# Patient Record
Sex: Female | Born: 1950 | Race: Black or African American | Hispanic: No | Smoking: Never smoker
Health system: Southern US, Community
[De-identification: ages and names within clinical notes are randomized; demographics above are authoritative.]

## PROBLEM LIST (undated history)

## (undated) DIAGNOSIS — I10 Essential (primary) hypertension: Secondary | ICD-10-CM

## (undated) DIAGNOSIS — I509 Heart failure, unspecified: Secondary | ICD-10-CM

## (undated) DIAGNOSIS — J40 Bronchitis, not specified as acute or chronic: Secondary | ICD-10-CM

## (undated) HISTORY — PX: CHOLECYSTECTOMY: SHX55

---

## 2013-07-08 ENCOUNTER — Inpatient Hospital Stay (HOSPITAL_BASED_OUTPATIENT_CLINIC_OR_DEPARTMENT_OTHER)
Admission: EM | Admit: 2013-07-08 | Discharge: 2013-07-11 | DRG: 292 | Disposition: A | Discharge status: Home / Self Care | Payer: BC Managed Care – PPO | Attending: Internal Medicine | Admitting: Internal Medicine

## 2013-07-08 ENCOUNTER — Encounter (HOSPITAL_BASED_OUTPATIENT_CLINIC_OR_DEPARTMENT_OTHER): Payer: Self-pay | Admitting: Emergency Medicine

## 2013-07-08 ENCOUNTER — Emergency Department (HOSPITAL_BASED_OUTPATIENT_CLINIC_OR_DEPARTMENT_OTHER): Payer: BC Managed Care – PPO

## 2013-07-08 DIAGNOSIS — J9601 Acute respiratory failure with hypoxia: Secondary | ICD-10-CM

## 2013-07-08 DIAGNOSIS — I10 Essential (primary) hypertension: Secondary | ICD-10-CM

## 2013-07-08 DIAGNOSIS — Z79899 Other long term (current) drug therapy: Secondary | ICD-10-CM

## 2013-07-08 DIAGNOSIS — I2789 Other specified pulmonary heart diseases: Secondary | ICD-10-CM | POA: Diagnosis present

## 2013-07-08 DIAGNOSIS — Z6841 Body Mass Index (BMI) 40.0 and over, adult: Secondary | ICD-10-CM

## 2013-07-08 DIAGNOSIS — N390 Urinary tract infection, site not specified: Secondary | ICD-10-CM

## 2013-07-08 DIAGNOSIS — Z888 Allergy status to other drugs, medicaments and biological substances status: Secondary | ICD-10-CM

## 2013-07-08 DIAGNOSIS — I5033 Acute on chronic diastolic (congestive) heart failure: Principal | ICD-10-CM | POA: Diagnosis present

## 2013-07-08 DIAGNOSIS — N183 Chronic kidney disease, stage 3 unspecified: Secondary | ICD-10-CM | POA: Diagnosis present

## 2013-07-08 DIAGNOSIS — I5031 Acute diastolic (congestive) heart failure: Secondary | ICD-10-CM | POA: Diagnosis present

## 2013-07-08 DIAGNOSIS — I498 Other specified cardiac arrhythmias: Secondary | ICD-10-CM | POA: Diagnosis present

## 2013-07-08 DIAGNOSIS — I129 Hypertensive chronic kidney disease with stage 1 through stage 4 chronic kidney disease, or unspecified chronic kidney disease: Secondary | ICD-10-CM | POA: Diagnosis present

## 2013-07-08 DIAGNOSIS — I160 Hypertensive urgency: Secondary | ICD-10-CM | POA: Diagnosis present

## 2013-07-08 DIAGNOSIS — Z9089 Acquired absence of other organs: Secondary | ICD-10-CM

## 2013-07-08 DIAGNOSIS — E441 Mild protein-calorie malnutrition: Secondary | ICD-10-CM | POA: Diagnosis present

## 2013-07-08 DIAGNOSIS — E66813 Obesity, class 3: Secondary | ICD-10-CM | POA: Diagnosis present

## 2013-07-08 DIAGNOSIS — I272 Pulmonary hypertension, unspecified: Secondary | ICD-10-CM

## 2013-07-08 DIAGNOSIS — I509 Heart failure, unspecified: Secondary | ICD-10-CM | POA: Diagnosis present

## 2013-07-08 DIAGNOSIS — A5901 Trichomonal vulvovaginitis: Secondary | ICD-10-CM | POA: Diagnosis present

## 2013-07-08 HISTORY — DX: Bronchitis, not specified as acute or chronic: J40

## 2013-07-08 HISTORY — DX: Morbid (severe) obesity due to excess calories: E66.01

## 2013-07-08 LAB — CBC
HCT: 37.6 % (ref 36.0–46.0)
HEMOGLOBIN: 12.1 g/dL (ref 12.0–15.0)
MCH: 29.5 pg (ref 26.0–34.0)
MCHC: 32.2 g/dL (ref 30.0–36.0)
MCV: 91.7 fL (ref 78.0–100.0)
Platelets: 258 10*3/uL (ref 150–400)
RBC: 4.1 MIL/uL (ref 3.87–5.11)
RDW: 15.5 % (ref 11.5–15.5)
WBC: 11.7 10*3/uL — ABNORMAL HIGH (ref 4.0–10.5)

## 2013-07-08 LAB — TROPONIN I: Troponin I: <0.3 ng/mL (ref <0.30)

## 2013-07-08 LAB — BASIC METABOLIC PANEL
BUN: 17 mg/dL (ref 6–23)
CALCIUM: 9.6 mg/dL (ref 8.4–10.5)
CO2: 27 meq/L (ref 19–32)
Chloride: 106 mEq/L (ref 96–112)
Creatinine, Ser: 1 mg/dL (ref 0.50–1.10)
GFR calc Af Amer: 68 mL/min — ABNORMAL LOW (ref >90)
GFR calc non Af Amer: 59 mL/min — ABNORMAL LOW (ref >90)
GLUCOSE: 128 mg/dL — AB (ref 70–99)
Potassium: 3.8 mEq/L (ref 3.7–5.3)
SODIUM: 145 meq/L (ref 137–147)

## 2013-07-08 LAB — PRO B NATRIURETIC PEPTIDE: PRO B NATRI PEPTIDE: 2519 pg/mL — AB (ref 0–125)

## 2013-07-08 MED ORDER — ALBUTEROL SULFATE (2.5 MG/3ML) 0.083% IN NEBU
5.0000 mg | INHALATION_SOLUTION | Freq: Once | RESPIRATORY_TRACT | Status: AC
  Administered 2013-07-08: 5 mg via RESPIRATORY_TRACT
  Filled 2013-07-08: qty 6

## 2013-07-08 MED ORDER — FUROSEMIDE 10 MG/ML IJ SOLN
40.0000 mg | Freq: Two times a day (BID) | INTRAMUSCULAR | Status: DC
  Administered 2013-07-08: 40 mg via INTRAVENOUS
  Filled 2013-07-08 (×4): qty 4

## 2013-07-08 MED ORDER — HYDRALAZINE HCL 20 MG/ML IJ SOLN
10.0000 mg | INTRAMUSCULAR | Status: DC | PRN
  Administered 2013-07-08: 10 mg via INTRAVENOUS
  Filled 2013-07-08 (×2): qty 1

## 2013-07-08 MED ORDER — LORAZEPAM 0.5 MG PO TABS
0.5000 mg | ORAL_TABLET | Freq: Once | ORAL | Status: AC
  Administered 2013-07-08: 0.5 mg via ORAL
  Filled 2013-07-08: qty 1

## 2013-07-08 MED ORDER — FUROSEMIDE 10 MG/ML IJ SOLN
40.0000 mg | Freq: Once | INTRAMUSCULAR | Status: AC
  Administered 2013-07-08: 40 mg via INTRAVENOUS
  Filled 2013-07-08: qty 4

## 2013-07-08 MED ORDER — SODIUM CHLORIDE 0.9 % IJ SOLN
3.0000 mL | Freq: Two times a day (BID) | INTRAMUSCULAR | Status: DC
  Administered 2013-07-08 – 2013-07-11 (×5): 3 mL via INTRAVENOUS

## 2013-07-08 MED ORDER — NITROGLYCERIN 0.4 MG SL SUBL
0.4000 mg | SUBLINGUAL_TABLET | SUBLINGUAL | Status: DC | PRN
Start: 2013-07-08 — End: 2013-07-11
  Filled 2013-07-08: qty 25

## 2013-07-08 MED ORDER — HYDROCODONE-ACETAMINOPHEN 5-325 MG PO TABS
1.0000 | ORAL_TABLET | Freq: Four times a day (QID) | ORAL | Status: DC | PRN
  Filled 2013-07-08 (×2): qty 1

## 2013-07-08 MED ORDER — HEPARIN SODIUM (PORCINE) 5000 UNIT/ML IJ SOLN
5000.0000 [IU] | Freq: Three times a day (TID) | INTRAMUSCULAR | Status: DC
  Administered 2013-07-08 – 2013-07-11 (×8): 5000 [IU] via SUBCUTANEOUS
  Filled 2013-07-08 (×11): qty 1

## 2013-07-08 MED ORDER — ACETAMINOPHEN 325 MG PO TABS
650.0000 mg | ORAL_TABLET | Freq: Four times a day (QID) | ORAL | Status: DC | PRN
  Administered 2013-07-08 – 2013-07-10 (×5): 650 mg via ORAL
  Filled 2013-07-08 (×5): qty 2

## 2013-07-08 MED ORDER — IBUPROFEN 800 MG PO TABS
800.0000 mg | ORAL_TABLET | Freq: Once | ORAL | Status: AC
  Administered 2013-07-08: 800 mg via ORAL
  Filled 2013-07-08: qty 1

## 2013-07-08 MED ORDER — ONDANSETRON HCL 4 MG/2ML IJ SOLN
4.0000 mg | Freq: Four times a day (QID) | INTRAMUSCULAR | Status: DC | PRN
  Administered 2013-07-08: 4 mg via INTRAVENOUS
  Filled 2013-07-08: qty 2

## 2013-07-08 MED ORDER — ASPIRIN EC 325 MG PO TBEC
325.0000 mg | DELAYED_RELEASE_TABLET | Freq: Every day | ORAL | Status: DC
  Administered 2013-07-08 – 2013-07-11 (×4): 325 mg via ORAL
  Filled 2013-07-08 (×4): qty 1

## 2013-07-08 MED ORDER — NITROGLYCERIN 0.4 MG SL SUBL
0.4000 mg | SUBLINGUAL_TABLET | Freq: Once | SUBLINGUAL | Status: AC
  Administered 2013-07-08: 0.4 mg via SUBLINGUAL
  Filled 2013-07-08: qty 1
  Filled 2013-07-08: qty 25

## 2013-07-08 NOTE — H&P (Signed)
Hospitalist Admission History and Physical  Patient name: Teresa Blackburn Medical record number: [id number] Date of birth: 05/31/1950 Age: 63 y.o. Gender: female  Primary Care Provider: No primary provider on file.  Chief Complaint: dyspnea, LE swelling, new onset CHF   History of Present Illness:This is a 63 y.o. year old morbidly obese female with no prior medical follow up presenting with progressive dyspnea, LE swelling and new onset CHF. Pt states that she has had progressive dyspnea and LE swelling over the past 3-4 months. Has been [facility] multiple [facility] for cough, dxd with bronchitis. Has had intermittent chest discomfort, orthopnea, and progressive edema. Has had to sleep in a chair for the last 2 months. Has been taking intermittent NSAIDs over this time frame as well as high salt intake. Has not had regular PCP follow up in the past. Denies any dysuria, nausea, vomiting, diarrhea, hemiparesis, confusion, headache, abd pain.   [facility] with worsening dyspnea. No noted hypoxia. Had CXR c/w CHF. Pro BNP @ 2500. Trop and EKG WNL. Noted WBC @ 11.7. Cr @ 1 w/ GFR 59. Noted BPs 200s/100s. Received lasix 40mg  IV x1.   Patient Active Problem List   Diagnosis Date Noted  . CHF (congestive heart failure) 07/08/2013  . CHF exacerbation 07/08/2013   Past Medical History: Past Medical History  Diagnosis Date  . Bronchitis   . Morbid obesity     Past Surgical History: Past Surgical History  Procedure Laterality Date  . Cesarean section    . Cholecystectomy      Social History: History   Social History  . Marital Status: Widowed    Spouse Name: N/A    Number of Children: N/A  . Years of Education: N/A   Social History Main Topics  . Smoking status: Never Smoker   . Smokeless tobacco: None  . Alcohol Use: No  . Drug Use: No  . Sexual Activity: None   Other Topics Concern  . None   Social History Narrative  . None    Family History: No family  history on file.  Allergies: Allergies  Allergen Reactions  . Codeine     Current Facility-Administered Medications  Medication Dose Route Frequency Provider Last Rate Last Dose  . aspirin EC tablet 325 mg  325 mg Oral Daily Doree AlbeeSteven Sherion Dooly, MD      . furosemide (LASIX) injection 40 mg  40 mg Intravenous Q12H Doree AlbeeSteven Caryl Fate, MD      . heparin injection 5,000 Units  5,000 Units Subcutaneous 3 times per day Doree AlbeeSteven Darlis Wragg, MD      . hydrALAZINE (APRESOLINE) injection 10 mg  10 mg Intravenous Q4H PRN Doree AlbeeSteven Grettell Ransdell, MD      . nitroGLYCERIN (NITROSTAT) SL tablet 0.4 mg  0.4 mg Sublingual Q5 min PRN Ethelda ChickMartha K Linker, MD      . sodium chloride 0.9 % injection 3 mL  3 mL Intravenous Q12H Doree AlbeeSteven Dayshaun Whobrey, MD       Review Of Systems: 12 point ROS negative except as noted above in HPI.  Physical Exam: Filed Vitals:   07/08/13 2100  BP:   Pulse: 89  Temp: 98.1 F (36.7 C)  Resp: 22    General: alert, no distress and morbidly obese HEENT: PERRLA and extra ocular movement intact Heart: S1, S2 normal, no murmur, rub or gallop, regular rate and rhythm Lungs: clear to auscultation, no wheezes or rales and unlabored breathing Abdomen: abdomen is soft without significant tenderness, masses, organomegaly or  guarding Extremities: 2+ peripheral pulses, >3+ edema bilaterally, no popliteal tenderness Skin:no rashes, no ecchymoses Neurology: normal without focal findings  Labs and Imaging: Lab Results  Component Value Date/Time   NA 145 07/08/2013  9:20 AM   K 3.8 07/08/2013  9:20 AM   CL 106 07/08/2013  9:20 AM   CO2 27 07/08/2013  9:20 AM   BUN 17 07/08/2013  9:20 AM   CREATININE 1.00 07/08/2013  9:20 AM   GLUCOSE 128* 07/08/2013  9:20 AM   Lab Results  Component Value Date   WBC 11.7* 07/08/2013   HGB 12.1 07/08/2013   HCT 37.6 07/08/2013   MCV 91.7 07/08/2013   PLT 258 07/08/2013    Dg Chest 2 View  07/08/2013   CLINICAL DATA:  Shortness of breath  EXAM: CHEST  2 VIEW  COMPARISON:  None.  FINDINGS: Cardiac  shadow is at the upper limits of normal in size. Mild vascular congestion is noted as well as some patchy infiltrative changes bilaterally. This likely represents a component of congestive failure. Correlation with the clinical exam is recommended. No bony abnormality is noted.  IMPRESSION: Bilateral changes likely related to congestive failure.   Electronically Signed   By: Alcide Clever M.D.   On: 07/08/2013 08:31     Assessment and Plan: [name] is a 63 y.o. year old female presenting with dyspnea, LE swelling, CHF  CHF: New diagnosis. Likely multifactorial etiology with body habitus, salt intake and NSAID use contributing. IV lasix. 2D ECHO. Cycle CEs. Risk stratification labs. Daily weights. Strict is and Os. Telemetry bed.   Hypertensive urgency: suspect fluid status likely exacerbating this issue. Would like to see response to diuresis. Prn hydralazine. Telemetry bed. Risk stratification labs. Full dose ASA. Consider addition of NTG gtt if recalcitrant to diuresis.   Leukocycytosis: ? Reactive in setting of current presentation. No current signs of infections. CXR w/o infiltrate. Afebrile. Check UA. Will trend.   FEN/GI: heart healthy, low sodium diet.  Prophylaxis: sub q heparin  Disposition: pending further evaluation.  Code Status:full code.        Doree Albee MD  Pager: [phone]

## 2013-07-08 NOTE — ED Notes (Addendum)
Attempted two manual BP pressures and changed the BP cuff and still unable to obtain a BP, informed Pts Nurse Konrad FelixKatelyn

## 2013-07-08 NOTE — ED Notes (Signed)
Pt in bathroom.  States that she is okay and will call out when needed.

## 2013-07-08 NOTE — ED Notes (Signed)
Pt transported via [facility]  

## 2013-07-08 NOTE — ED Provider Notes (Signed)
CSN: [id number]     Arrival date & time 07/08/13  2130 History   First MD Initiated Contact with Patient 07/08/13 [id number]     Chief Complaint  Patient presents with  . Shortness of Breath     (Consider location/radiation/quality/duration/timing/severity/associated sxs/prior Treatment) HPI Pt presents with shortness of breath and cough.  She states that symptoms began 3-4 days ago and feel like her prior episodes of bronchitis.  She has not taken anything for her symptoms.  Cough is nonproductive.  She feels that her shortness of breath is worse than prior episodes.  No chest pain. No fever/chills.  No change in her baseline leg swelling.  There are no other associated systemic symptoms, there are no other alleviating or modifying factors.   Past Medical History  Diagnosis Date  . Bronchitis   . Morbid obesity    Past Surgical History  Procedure Laterality Date  . Cesarean section    . Cholecystectomy     No family history on file. History  Substance Use Topics  . Smoking status: Never Smoker   . Smokeless tobacco: Not on file  . Alcohol Use: No   OB History   Grav Para Term Preterm Abortions TAB SAB Ect Mult Living                 Review of Systems ROS reviewed and all otherwise negative except for mentioned in HPI    Allergies  Codeine  Home Medications  No current outpatient prescriptions on file. BP 207/99  Pulse 96  Temp(Src) 98.4 F (36.9 C) (Oral)  Resp 20  Ht 5\' 2"  (1.575 m)  Wt 320 lb (145.151 kg)  BMI 58.51 kg/m2  SpO2 99% Vitals reviewed Physical Exam Physical Examination: General appearance - alert, well appearing, and in no distress, overweight Mental status - alert, oriented to person, place, and time Eyes - no conjunctival injection, no scleral icterus Mouth - mucous membranes moist, pharynx normal without lesions Chest - clear to auscultation, no wheezes, rales or rhonchi, symmetric air entry, decreased air movement bilaterally Heart - normal  rate, regular rhythm, normal S1, S2, no murmurs, rubs, clicks or gallops Abdomen - soft, nontender, nondistended, no masses or organomegaly Extremities - peripheral pulses normal, no pedal edema- nonpitting edema at ankles bilaterally, no clubbing or cyanosis Skin - normal coloration and turgor, no rashes  ED Course  Procedures (including critical care time)  11:48 AM pt has been in the bathroom, she has just come out and I have updated her about findings and plan for admission.  She [facility].   12:11 PM d/w Dr. Waymon Amato, pending repeat manual blood pressure to determine step down versus telemetry. Will let [facility] know.   CRITICAL CARE Performed by: Ethelda Chick Total critical care time: 35 Critical care time was exclusive of separately billable procedures and treating other patients. Critical care was necessary to treat or prevent imminent or life-threatening deterioration. Critical care was time spent personally by me on the following activities: development of treatment plan with patient and/or surrogate as well as nursing, discussions with consultants, evaluation of patient's response to treatment, examination of patient, obtaining history from patient or surrogate, ordering and performing treatments and interventions, ordering and review of laboratory studies, ordering and review of radiographic studies, pulse oximetry and re-evaluation of patient's condition.   Date: 07/08/2013  Rate: 106  Rhythm: sinus tachycardia  QRS Axis: normal  Intervals: normal  ST/T Wave abnormalities: normal  Conduction Disutrbances:none  Narrative Interpretation:   Old EKG Reviewed: none available EKG not available in EPIC for interpretation in MUSE  Labs Review Labs Reviewed  CBC - Abnormal; Notable for the following:    WBC 11.7 (*)    All other components within normal limits  BASIC METABOLIC PANEL - Abnormal; Notable for the following:    Glucose, Bld 128 (*)     GFR calc non Af Amer 59 (*)    GFR calc Af Amer 68 (*)    All other components within normal limits  PRO B NATRIURETIC PEPTIDE - Abnormal; Notable for the following:    Pro B Natriuretic peptide (BNP) 2519.0 (*)    All other components within normal limits  TROPONIN I   Imaging Review Dg Chest 2 View  07/08/2013   CLINICAL DATA:  Shortness of breath  EXAM: CHEST  2 VIEW  COMPARISON:  None.  FINDINGS: Cardiac shadow is at the upper limits of normal in size. Mild vascular congestion is noted as well as some patchy infiltrative changes bilaterally. This likely represents a component of congestive failure. Correlation with the clinical exam is recommended. No bony abnormality is noted.  IMPRESSION: Bilateral changes likely related to congestive failure.   Electronically Signed   By: Alcide CleverMark  Lukens M.D.   On: 07/08/2013 08:31     EKG Interpretation None      MDM   Final diagnoses:  CHF exacerbation  Hypertension    Pt presenting with c/o shortness of breath and cough.  Workup reveals fluid overload on CXR. EKG shows sinus tachycardia, pt intially with blood pressure relatively normal, repeat values have been elevated, pt given lasix IV, as well as nitroglycerin.  [facility]     Ethelda ChickMartha K Linker, MD 07/08/13 [phone]

## 2013-07-08 NOTE — ED Notes (Signed)
Difficult manual blood pressure.

## 2013-07-08 NOTE — ED Notes (Addendum)
Pt c/o shortness of breath x 2 days. Pt sts this happens this time of year and has been diagnosed with bronchitis. Pt sts she does not have a PCP. Pt also c/o left foot pain x 5 months.

## 2013-07-08 NOTE — ED Notes (Signed)
Pt in the bathroom.  Manual BP to be obtained when pt returns to room.

## 2013-07-08 NOTE — ED Notes (Signed)
Phone report given to [facility] 

## 2013-07-09 DIAGNOSIS — I272 Pulmonary hypertension, unspecified: Secondary | ICD-10-CM | POA: Diagnosis present

## 2013-07-09 DIAGNOSIS — I160 Hypertensive urgency: Secondary | ICD-10-CM | POA: Diagnosis present

## 2013-07-09 DIAGNOSIS — J9601 Acute respiratory failure with hypoxia: Secondary | ICD-10-CM | POA: Diagnosis present

## 2013-07-09 DIAGNOSIS — E66813 Obesity, class 3: Secondary | ICD-10-CM | POA: Diagnosis present

## 2013-07-09 DIAGNOSIS — N183 Chronic kidney disease, stage 3 unspecified: Secondary | ICD-10-CM | POA: Diagnosis present

## 2013-07-09 DIAGNOSIS — I10 Essential (primary) hypertension: Secondary | ICD-10-CM

## 2013-07-09 DIAGNOSIS — I369 Nonrheumatic tricuspid valve disorder, unspecified: Secondary | ICD-10-CM

## 2013-07-09 DIAGNOSIS — I5031 Acute diastolic (congestive) heart failure: Secondary | ICD-10-CM | POA: Diagnosis present

## 2013-07-09 DIAGNOSIS — J9600 Acute respiratory failure, unspecified whether with hypoxia or hypercapnia: Secondary | ICD-10-CM

## 2013-07-09 LAB — CBC WITH DIFFERENTIAL/PLATELET
BASOS PCT: 0 % (ref 0–1)
Basophils Absolute: 0 10*3/uL (ref 0.0–0.1)
EOS ABS: 0.1 10*3/uL (ref 0.0–0.7)
Eosinophils Relative: 1 % (ref 0–5)
HEMATOCRIT: 33.2 % — AB (ref 36.0–46.0)
HEMOGLOBIN: 10.7 g/dL — AB (ref 12.0–15.0)
Lymphocytes Relative: 17 % (ref 12–46)
Lymphs Abs: 1.9 10*3/uL (ref 0.7–4.0)
MCH: 29.3 pg (ref 26.0–34.0)
MCHC: 32.2 g/dL (ref 30.0–36.0)
MCV: 91 fL (ref 78.0–100.0)
MONO ABS: 0.8 10*3/uL (ref 0.1–1.0)
Monocytes Relative: 7 % (ref 3–12)
Neutro Abs: 8.1 10*3/uL — ABNORMAL HIGH (ref 1.7–7.7)
Neutrophils Relative %: 75 % (ref 43–77)
Platelets: 246 10*3/uL (ref 150–400)
RBC: 3.65 MIL/uL — ABNORMAL LOW (ref 3.87–5.11)
RDW: 15.7 % — ABNORMAL HIGH (ref 11.5–15.5)
WBC: 10.9 10*3/uL — ABNORMAL HIGH (ref 4.0–10.5)

## 2013-07-09 LAB — TSH: TSH: 2.04 u[IU]/mL (ref 0.350–4.500)

## 2013-07-09 LAB — COMPREHENSIVE METABOLIC PANEL
ALK PHOS: 56 U/L (ref 39–117)
ALT: 36 U/L — AB (ref 0–35)
AST: 33 U/L (ref 0–37)
Albumin: 3.1 g/dL — ABNORMAL LOW (ref 3.5–5.2)
BUN: 16 mg/dL (ref 6–23)
CALCIUM: 9 mg/dL (ref 8.4–10.5)
CO2: 27 mEq/L (ref 19–32)
Chloride: 105 mEq/L (ref 96–112)
Creatinine, Ser: 1.28 mg/dL — ABNORMAL HIGH (ref 0.50–1.10)
GFR calc Af Amer: 51 mL/min — ABNORMAL LOW (ref >90)
GFR calc non Af Amer: 44 mL/min — ABNORMAL LOW (ref >90)
Glucose, Bld: 99 mg/dL (ref 70–99)
POTASSIUM: 3.8 meq/L (ref 3.7–5.3)
Sodium: 145 mEq/L (ref 137–147)
TOTAL PROTEIN: 6.6 g/dL (ref 6.0–8.3)
Total Bilirubin: 0.6 mg/dL (ref 0.3–1.2)

## 2013-07-09 LAB — CBC
HEMATOCRIT: 34.9 % — AB (ref 36.0–46.0)
HEMOGLOBIN: 11.4 g/dL — AB (ref 12.0–15.0)
MCH: 29.5 pg (ref 26.0–34.0)
MCHC: 32.7 g/dL (ref 30.0–36.0)
MCV: 90.2 fL (ref 78.0–100.0)
Platelets: 245 10*3/uL (ref 150–400)
RBC: 3.87 MIL/uL (ref 3.87–5.11)
RDW: 15.5 % (ref 11.5–15.5)
WBC: 11.7 10*3/uL — AB (ref 4.0–10.5)

## 2013-07-09 LAB — CREATININE, SERUM
Creatinine, Ser: 0.91 mg/dL (ref 0.50–1.10)
GFR calc non Af Amer: 66 mL/min — ABNORMAL LOW (ref >90)
GFR, EST AFRICAN AMERICAN: 77 mL/min — AB (ref >90)

## 2013-07-09 LAB — LIPID PANEL
CHOL/HDL RATIO: 2.4 ratio
CHOLESTEROL: 115 mg/dL (ref 0–200)
HDL: 48 mg/dL (ref >39)
LDL Cholesterol: 57 mg/dL (ref 0–99)
Triglycerides: 49 mg/dL (ref <150)
VLDL: 10 mg/dL (ref 0–40)

## 2013-07-09 LAB — MRSA PCR SCREENING: MRSA by PCR: NEGATIVE

## 2013-07-09 LAB — TROPONIN I
Troponin I: <0.3 ng/mL (ref <0.30)
Troponin I: <0.3 ng/mL (ref <0.30)
Troponin I: <0.3 ng/mL (ref <0.30)

## 2013-07-09 LAB — HEMOGLOBIN A1C
Hgb A1c MFr Bld: 5.6 % (ref <5.7)
MEAN PLASMA GLUCOSE: 114 mg/dL (ref <117)

## 2013-07-09 MED ORDER — LISINOPRIL 5 MG PO TABS: 5.0000 mg | ORAL_TABLET | Freq: Every day | ORAL | Status: DC

## 2013-07-09 MED ORDER — BENZONATATE 100 MG PO CAPS
100.0000 mg | ORAL_CAPSULE | Freq: Four times a day (QID) | ORAL | Status: DC | PRN
  Administered 2013-07-09: 100 mg via ORAL
  Filled 2013-07-09: qty 1

## 2013-07-09 MED ORDER — AMLODIPINE BESYLATE 10 MG PO TABS
10.0000 mg | ORAL_TABLET | Freq: Every day | ORAL | Status: DC
  Administered 2013-07-09 – 2013-07-11 (×3): 10 mg via ORAL
  Filled 2013-07-09 (×3): qty 1

## 2013-07-09 NOTE — Progress Notes (Signed)
  Echocardiogram 2D Echocardiogram has been performed.  Jorje GuildCHUNG, Chamaine Stankus 07/09/2013, 10:38 AM

## 2013-07-09 NOTE — Progress Notes (Signed)
Notified hospitalist of patient requesting something for severe cough. Orders given for Tessalon pearl. Carried out order. Will monitor patient to end of shift.

## 2013-07-09 NOTE — Progress Notes (Signed)
Utilization Review Completed.  

## 2013-07-09 NOTE — Care Management Note (Signed)
    Page 1 of 1   07/10/2013     10:36:59 AM   CARE MANAGEMENT NOTE 07/10/2013  Patient:  Elba BarmanHUNTER,Chelesa   Account Number:  [id number]  Date Initiated:  07/09/2013  Documentation initiated by:  MAYO,HENRIETTA  Subjective/Objective Assessment:   dx CHF; lives with son, no PCP     Action/Plan:   Provided pt with number for [facility] which will give her contact information for doctors accepting new patients and contracted with her insurance   Anticipated DC Date:  07/11/2013   Anticipated DC Plan:  HOME/SELF CARE      DC Planning Services  CM consult  PCP issues      Choice offered to / List presented to:             Status of service:   Medicare Important Message given?   (If response is "NO", the following Medicare IM given date fields will be blank) Date Medicare IM given:   Date Additional Medicare IM given:    Discharge Disposition:    Per UR Regulation:  Reviewed for med. necessity/level of care/duration of stay  If discussed at Long Length of Stay Meetings, dates discussed:    Comments:  07/10/13 0950 Oletta Cohnamellia Dilia Alemany, RN, BSN, NCM [phone] Offered pt list of resources for Primary Care Providers to choose a PCP.  [place] and would prefer a PCP in that area.  Will follow up with pt prior to d/c home with her progression.

## 2013-07-09 NOTE — Progress Notes (Signed)
[facility]  Elba BarmanBrenda Mcallister WGN:[id number] DOB: [birth year] DOA: 07/08/2013 PCP: No primary provider on file.  Time spent :  Brief narrative: 63 y.o. year old morbidly obese female with no prior medical follow up presenting with progressive dyspnea, LE swelling and new onset CHF. Pt states that she has had progressive dyspnea and LE swelling over the past 3-4 months. Has been seen multiple times at local [facility]Cs for cough, dxd with bronchitis. Has had intermittent chest discomfort, orthopnea, and progressive edema. Has had to sleep in a chair for the last 2 months. Has been taking intermittent NSAIDs over this time frame as well as high salt intake. Has not had regular PCP follow up in the past. Denies any dysuria, nausea, vomiting, diarrhea, hemiparesis, confusion, headache, abd pain.  [facility] dysp[facility]. No noted hypoxia. Had CXR c/w CHF. Pro BNP @ 2500. Trop and EKG WNL. Noted WBC @ 11.7. Cr @ 1 w/ GFR 59. Noted BPs 200s/100s. Received lasix 40mg  IV x1.    HPI/Subjective: No further SOB. Says hasn't been to MD regularly for preventive care in years.  Assessment/Plan: Active Problems: HTN urgency -BP moderate control -begin ACE I (see below) -cont prn IV Hydralazine  Acute resp failure with hypoxia/Acute grade 2 diastolic CHF exacerbation -no hypoxia -lungs clear on exam so dc Lasix -has diastolic dysfunction- ACE will not help- start CCB and d/c home in AM if BP controlled  CKD class 3 -baseline Scr ??- current readings c/w  Stage 3  Morbid obesity, BMI 55, Class 3 -nutrition counseling for weight reduction strategies and low sodium diet  Pulmonary HTN _ new finding of moderate pulmonary HTN -likely needs OP PSG    DVT prophylaxis: Subcutaneous heparin Code Status: Full Family Communication: Patient and daughters at bedside Disposition Plan/Expected LOS: Transfer to floor   Consultants: None  Procedures: 2-D  echocardiogram  - Left ventricle: The cavity size was normal. There was moderate concentric hypertrophy. Systolic function was normal. The estimated ejection fraction was in the range of 50% to 55%. Wall motion was normal; there were no regional wall motion abnormalities. Features are consistent with a pseudonormal left ventricular filling pattern, with concomitant abnormal relaxation and increased filling pressure (grade 2 diastolic dysfunction). Doppler parameters are consistent with elevated ventricular end-diastolic filling pressure. - Aortic valve: Trileaflet; normal thickness leaflets. No regurgitation. - Mitral valve: Trivial regurgitation. - Left atrium: The atrium was moderately dilated. - Right ventricle: Systolic function was normal. - Tricuspid valve: Mild regurgitation. - Pulmonic valve: No regurgitation. - Pulmonary arteries: Systolic pressure was moderately increased. PA peak pressure: 52mm Hg (S). - Inferior vena cava: The vessel was normal in size. - Pericardium, extracardiac: There was no pericardial effusion.  Antibiotics: None  Objective: Blood pressure 144/71, pulse 85, temperature 98.1 F (36.7 C), temperature source Oral, resp. rate 25, height 5\' 2"  (1.575 m), weight 334 lb 14.1 oz (151.9 kg), SpO2 97.00%.  Intake/Output Summary (Last 24 hours) at 07/09/13 1221 Last data filed at 07/09/13 0037  Gross per 24 hour  Intake      0 ml  Output      0 ml  Net      0 ml     Exam: General: No acute respiratory distress Lungs: Clear to auscultation bilaterally without wheezes or crackles, RA Cardiovascular: Regular rate and rhythm without murmur gallop or rub normal S1 and S2, no peripheral edema or JVD Abdomen: Nontender, nondistended, soft, bowel  sounds positive, no rebound, no ascites, no appreciable mass Musculoskeletal: No significant cyanosis, clubbing of bilateral lower extremities Neurological: Alert and oriented x 3, moves all extremities x 4  without focal neurological deficits, CN 2-12 intact  Scheduled Meds:  Scheduled Meds: . aspirin EC  325 mg Oral Daily  . heparin  5,000 Units Subcutaneous 3 times per day  . sodium chloride  3 mL Intravenous Q12H   Continuous Infusions:   Data Reviewed: Basic Metabolic Panel:  Recent Labs Lab 07/08/13 0920 07/08/13 2332 07/09/13 0408  NA 145  --  145  K 3.8  --  3.8  CL 106  --  105  CO2 27  --  27  GLUCOSE 128*  --  99  BUN 17  --  16  CREATININE 1.00 0.91 1.28*  CALCIUM 9.6  --  9.0   Liver Function Tests:  Recent Labs Lab 07/09/13 0408  AST 33  ALT 36*  ALKPHOS 56  BILITOT 0.6  PROT 6.6  ALBUMIN 3.1*   No results found for this basename: LIPASE, AMYLASE,  in the last 168 hours No results found for this basename: AMMONIA,  in the last 168 hours CBC:  Recent Labs Lab 07/08/13 0920 07/08/13 2332 07/09/13 0408  WBC 11.7* 11.7* 10.9*  NEUTROABS  --   --  8.1*  HGB 12.1 11.4* 10.7*  HCT 37.6 34.9* 33.2*  MCV 91.7 90.2 91.0  PLT 258 245 246   Cardiac Enzymes:  Recent Labs Lab 07/08/13 0920 07/08/13 2322 07/09/13 0408 07/09/13 1040  TROPONINI <0.30 <0.30 <0.30 <0.30   BNP (last 3 results)  Recent Labs  07/08/13 0920  PROBNP 2519.0*   CBG: No results found for this basename: GLUCAP,  in the last 168 hours  Recent Results (from the past 240 hour(s))  MRSA PCR SCREENING     Status: None   Collection Time    07/08/13  9:54 PM      Result Value Ref Range Status   MRSA by PCR NEGATIVE  NEGATIVE Final   Comment:            The GeneXpert MRSA Assay (FDA     approved for NASAL specimens     only), is one component of a     comprehensive MRSA colonization     surveillance program. It is not     intended to diagnose MRSA     infection nor to guide or     monitor treatment for     MRSA infections.     Studies:  Recent x-ray studies have been reviewed in detail by the Attending Physician       Junious Silk, ANP [facility] Office  [phone] Pager [phone]  **If unable to reach the above provider after paging please contact the Flow Manager @ [phone]  On-Call/Text Page:      [email or web address]      password [email or web address]  If 7PM-7AM, please contact night-coverage [email or web address] Password [email or web address] 07/09/2013, 12:21 PM   LOS: 1 day

## 2013-07-09 NOTE — Clinical Documentation Improvement (Signed)
PLEASE SPECIFY TYPE AND ACUITY OF CHF Possible Clinical Conditions?  Acute Systolic Congestive Heart Failure Acute Diastolic Congestive Heart Failure Acute Systolic & Diastolic Congestive Heart Failure Acute on Chronic Systolic Congestive Heart Failure Acute on Chronic Diastolic Congestive Heart Failure Acute on Chronic Systolic & Diastolic Congestive Heart Failure Chronic Systolic Congestive Heart Failure Chronic Diastolic Congestive Heart Failure Chronic Systolic & Diastolic Congestive Heart Failure Other Condition Cannot Clinically Determine  Supporting Information: (As per notes) "CHF: New diagnosis. Likely multifactorial etiology with body habitus, salt intake and NSAID use contributing. IV lasix. 2D ECHO. Cycle CEs. Risk stratification labs. Daily weights. Strict is and Os. Telemetry bed."    Thank You, Nevin BloodgoodJoan B Nimah Uphoff, RN, BSN, CCDS, Clinical Documentation Specialist:  [phone]   [phone] [facility] [place]- Health Information Management

## 2013-07-10 LAB — CBC WITH DIFFERENTIAL/PLATELET
BASOS ABS: 0.1 10*3/uL (ref 0.0–0.1)
BASOS PCT: 1 % (ref 0–1)
Eosinophils Absolute: 0.4 10*3/uL (ref 0.0–0.7)
Eosinophils Relative: 6 % — ABNORMAL HIGH (ref 0–5)
HCT: 34.5 % — ABNORMAL LOW (ref 36.0–46.0)
HEMOGLOBIN: 11.3 g/dL — AB (ref 12.0–15.0)
Lymphocytes Relative: 35 % (ref 12–46)
Lymphs Abs: 2.7 10*3/uL (ref 0.7–4.0)
MCH: 29.8 pg (ref 26.0–34.0)
MCHC: 32.8 g/dL (ref 30.0–36.0)
MCV: 91 fL (ref 78.0–100.0)
MONOS PCT: 10 % (ref 3–12)
Monocytes Absolute: 0.8 10*3/uL (ref 0.1–1.0)
NEUTROS ABS: 3.7 10*3/uL (ref 1.7–7.7)
Neutrophils Relative %: 49 % (ref 43–77)
Platelets: 231 10*3/uL (ref 150–400)
RBC: 3.79 MIL/uL — ABNORMAL LOW (ref 3.87–5.11)
RDW: 15.8 % — ABNORMAL HIGH (ref 11.5–15.5)
WBC: 7.7 10*3/uL (ref 4.0–10.5)

## 2013-07-10 LAB — URINALYSIS, ROUTINE W REFLEX MICROSCOPIC
BILIRUBIN URINE: NEGATIVE
GLUCOSE, UA: NEGATIVE mg/dL
Ketones, ur: NEGATIVE mg/dL
Nitrite: NEGATIVE
PH: 7.5 (ref 5.0–8.0)
Protein, ur: 30 mg/dL — AB
Specific Gravity, Urine: 1.017 (ref 1.005–1.030)
Urobilinogen, UA: 1 mg/dL (ref 0.0–1.0)

## 2013-07-10 LAB — COMPREHENSIVE METABOLIC PANEL
ALT: 36 U/L — AB (ref 0–35)
AST: 30 U/L (ref 0–37)
Albumin: 3 g/dL — ABNORMAL LOW (ref 3.5–5.2)
Alkaline Phosphatase: 53 U/L (ref 39–117)
BUN: 21 mg/dL (ref 6–23)
CALCIUM: 8.6 mg/dL (ref 8.4–10.5)
CO2: 26 meq/L (ref 19–32)
CREATININE: 1.27 mg/dL — AB (ref 0.50–1.10)
Chloride: 105 mEq/L (ref 96–112)
GFR, EST AFRICAN AMERICAN: 51 mL/min — AB (ref >90)
GFR, EST NON AFRICAN AMERICAN: 44 mL/min — AB (ref >90)
Glucose, Bld: 92 mg/dL (ref 70–99)
Potassium: 4.1 mEq/L (ref 3.7–5.3)
SODIUM: 144 meq/L (ref 137–147)
Total Bilirubin: 0.4 mg/dL (ref 0.3–1.2)
Total Protein: 6.6 g/dL (ref 6.0–8.3)

## 2013-07-10 LAB — URINE MICROSCOPIC-ADD ON

## 2013-07-10 MED ORDER — CARVEDILOL 3.125 MG PO TABS
3.1250 mg | ORAL_TABLET | Freq: Two times a day (BID) | ORAL | Status: DC
  Administered 2013-07-11: 3.125 mg via ORAL
  Filled 2013-07-10 (×3): qty 1

## 2013-07-10 NOTE — Consult Note (Addendum)
Heart Failure Navigator Consult Note  Presentation: Teresa Blackburn this 63 y.o. year old morbidly obese female with no prior medical follow up presenting with progressive dyspnea, LE swelling and new onset CHF. Pt states that she has had progressive dyspnea and LE swelling over the past 3-4 months. Has been seen multiple times at local [facility] for cough, dxd with bronchitis. Has had intermittent chest discomfort, orthopnea, and progressive edema. Has had to sleep in a chair for the last 2 months. Has been taking intermittent NSAIDs over this time frame as well as high salt intake. Has not had regular PCP follow up in the past. Denies any dysuria, nausea, vomiting, diarrhea, hemiparesis, confusion, headache, abd pain.  Presented today to [facility] with worsening dyspnea. No noted hypoxia. Had CXR c/w CHF. Pro BNP @ 2500. Trop and EKG WNL. Noted WBC @ 11.7. Cr @ 1 w/ GFR 59. Noted BPs 200s/100s. Received lasix 40mg  IV x1.     Past Medical History  Diagnosis Date  . Bronchitis   . Morbid obesity     History   Social History  . Marital Status: Widowed    Spouse Name: N/A    Number of Children: N/A  . Years of Education: N/A   Social History Main Topics  . Smoking status: Never Smoker   . Smokeless tobacco: None  . Alcohol Use: No  . Drug Use: No  . Sexual Activity: None   Other Topics Concern  . None   Social History Narrative  . None    ECHO:Study Conclusions-- 07/09/13  - Left ventricle: The cavity size was normal. There was moderate concentric hypertrophy. Systolic function was normal. The estimated ejection fraction was in the range of 50% to 55%. Wall motion was normal; there were no regional wall motion abnormalities. Features are consistent with a pseudonormal left ventricular filling pattern, with concomitant abnormal relaxation and increased filling pressure (grade 2 diastolic dysfunction). Doppler parameters are consistent with elevated ventricular end-diastolic filling  pressure. - Aortic valve: Trileaflet; normal thickness leaflets. No regurgitation. - Mitral valve: Trivial regurgitation. - Left atrium: The atrium was moderately dilated. - Right ventricle: Systolic function was normal. - Tricuspid valve: Mild regurgitation. - Pulmonic valve: No regurgitation. - Pulmonary arteries: Systolic pressure was moderately increased. PA peak pressure: 52mm Hg (S). - Inferior vena cava: The vessel was normal in size. - Pericardium, extracardiac: There was no pericardial effusion.   BNP    Component Value Date/Time   PROBNP 2519.0* 07/08/2013 0920    Education Assessment and Provision:  Detailed education and instructions provided on heart failure disease management including the following:  Signs and symptoms of Heart Failure When to call the physician Importance of daily weights Low sodium diet  Fluid restriction Medication management Anticipated future follow-up appointments  Patient education given on each of the above topics.  Patient acknowledges understanding and acceptance of all instructions.  She lives with her son.  She also has 2 local sisters who can provide support if needed.  I have given her additional written materials about sodium content in foods and low sodium HF diet.  She seems receptive to all instructions.  She has a scale and does not see any barriers to daily weights.  She has established a PCP with a Dr. Tresa Endo in [facility].--appt on 4/ 13/15.  I was able to also get an appt with Dr. Jens Som in [facility] on May 20th at 11am because she prefers her appointments stay in [facility].  Education Materials:  "Living Better With Heart Failure" Booklet, Daily Weight Tracker Tool and Heart Failure Educational Video.   High Risk Criteria for Readmission and/or Poor Patient Outcomes:   EF <30%- No- 50-55%  2 or more admissions in 6 months- No  Difficult social situation-No  Demonstrates medication noncompliance-  No   Barriers of Care: Knowledge of medical condition.  This is a new diagnosis and therefore she will need further education and follow- up.    Discharge Planning:  She plans to discharge to home with son.  [facility]-- To assist with transition home and provide additional education as well as compliance reinforcement.

## 2013-07-10 NOTE — Progress Notes (Signed)
[facility]  Elba BarmanBrenda Balke ZOX:[id number] DOB: Jun 03, 1950 DOA: 07/08/2013 PCP: No primary provider on file.  Time spent : 35mins  Brief narrative: 63 y.o. year old morbidly obese female with no prior medical follow up presenting with progressive dyspnea, LE swelling. Pt states that she has had progressive dyspnea and LE swelling over the past 3-4 months. Has been seen multiple times at local UCs for cough, dxd with bronchitis. Has had intermittent chest discomfort, orthopnea, and progressive edema. Has had to sleep in a chair for the last 2 months. Has been taking intermittent NSAIDs over this time frame as well as high salt intake. Has not had regular PCP follow up in the past. Denies any dysuria, nausea, vomiting, diarrhea, hemiparesis, confusion, headache, abd pain.  [facility]. No noted hypoxia. Had CXR c/w CHF. Pro BNP @ 2500. Trop and EKG WNL. Noted WBC @ 11.7. Cr @ 1 w/ GFR 59. Noted BPs 200s/100s. Received lasix 40mg  IV x1.   HPI/Subjective: No SOB or orthopnea; emotional and blamed self for current health status - emotional support given by attending MD  Assessment/Plan:  HTN urgency -BP moderate control -began CCB 4/7 but may be poor choice if exacerbates LE edema - follow  -cont prn IV Hydralazine  Acute resp failure with hypoxia / Acute grade 2 diastolic CHF exacerbation -no hypoxia -lungs clear on exam so dc'd Lasix 4/7 -has diastolic dysfunction - started CCB 4/6 and BP better controlled  Acute kidney injury / CKD class 3 -baseline Scr ??- current readings c/w  Stage 3  -Scr has bumped since admission therefore no ACE or ARB for now, and holding diuretic  -repeat BMET in am - may have been somewhat over diuresed -follow up on UA  Morbid obesity, BMI 55, Class 3 -nutrition counseling for weight reduction strategies and low sodium diet  Pulmonary HTN -new finding of moderate pulmonary HTN -needs OP  PSG   Back pain - dysuria  -check UA/cx  DVT prophylaxis: Subcutaneous heparin Code Status: Full Family Communication: no family present at time of exam today  Disposition Plan/Expected LOS: dc home in am if renal function improved - pt's sister has scheduled OP appointment with new PCP (? Dr. Lorenz CoasterKeller with Dr. Riley NearingAguiar) for 4/13  Consultants: None  Procedures: 2-D echocardiogram  - Left ventricle: The cavity size was normal. There was moderate concentric hypertrophy. Systolic function was normal. The estimated ejection fraction was in the range of 50% to 55%. Wall motion was normal; there were no regional wall motion abnormalities. Features are consistent with a pseudonormal left ventricular filling pattern, with concomitant abnormal relaxation and increased filling pressure (grade 2 diastolic dysfunction). Doppler parameters are consistent with elevated ventricular end-diastolic filling pressure. - Aortic valve: Trileaflet; normal thickness leaflets. No regurgitation. - Mitral valve: Trivial regurgitation. - Left atrium: The atrium was moderately dilated. - Right ventricle: Systolic function was normal. - Tricuspid valve: Mild regurgitation. - Pulmonic valve: No regurgitation. - Pulmonary arteries: Systolic pressure was moderately increased. PA peak pressure: 52mm Hg (S). - Inferior vena cava: The vessel was normal in size. - Pericardium, extracardiac: There was no pericardial effusion.  Antibiotics: None  Objective: Blood pressure 157/97, pulse 86, temperature 98.2 F (36.8 C), temperature source Oral, resp. rate 20, height 5\' 2"  (1.575 m), weight 336 lb 6.8 oz (152.6 kg), SpO2 95.00%.  Intake/Output Summary (Last 24 hours) at 07/10/13 1334 Last data filed at 07/10/13 0900  Gross  per 24 hour  Intake   1203 ml  Output      0 ml  Net   1203 ml   Exam: General: No acute respiratory distress while at rest  Lungs: Clear to auscultation bilaterally without wheezes or  crackles, RA - distant breath sounds  Cardiovascular: Regular rate and rhythm without murmur gallop or rub normal S1 and S2 Abdomen: Nontender, nondistended, soft, bowel sounds positive, no rebound, no ascites, no appreciable mass Musculoskeletal: No significant cyanosis, clubbing of bilateral lower extremities - trace B LE edema  Neurological: Alert and oriented x 3, moves all extremities x 4 without focal neurological deficits, CN 2-12 intact  Scheduled Meds:  Scheduled Meds: . amLODipine  10 mg Oral Daily  . aspirin EC  325 mg Oral Daily  . heparin  5,000 Units Subcutaneous 3 times per day  . sodium chloride  3 mL Intravenous Q12H   Data Reviewed: Basic Metabolic Panel:  Recent Labs Lab 07/08/13 0920 07/08/13 2332 07/09/13 0408 07/10/13 0444  NA 145  --  145 144  K 3.8  --  3.8 4.1  CL 106  --  105 105  CO2 27  --  27 26  GLUCOSE 128*  --  99 92  BUN 17  --  16 21  CREATININE 1.00 0.91 1.28* 1.27*  CALCIUM 9.6  --  9.0 8.6   Liver Function Tests:  Recent Labs Lab 07/09/13 0408 07/10/13 0444  AST 33 30  ALT 36* 36*  ALKPHOS 56 53  BILITOT 0.6 0.4  PROT 6.6 6.6  ALBUMIN 3.1* 3.0*   CBC:  Recent Labs Lab 07/08/13 0920 07/08/13 2332 07/09/13 0408 07/10/13 0444  WBC 11.7* 11.7* 10.9* 7.7  NEUTROABS  --   --  8.1* 3.7  HGB 12.1 11.4* 10.7* 11.3*  HCT 37.6 34.9* 33.2* 34.5*  MCV 91.7 90.2 91.0 91.0  PLT 258 245 246 231   Cardiac Enzymes:  Recent Labs Lab 07/08/13 0920 07/08/13 2322 07/09/13 0408 07/09/13 1040  TROPONINI <0.30 <0.30 <0.30 <0.30   BNP (last 3 results)  Recent Labs  07/08/13 0920  PROBNP 2519.0*    Recent Results (from the past 240 hour(s))  MRSA PCR SCREENING     Status: None   Collection Time    07/08/13  9:54 PM      Result Value Ref Range Status   MRSA by PCR NEGATIVE  NEGATIVE Final   Comment:            The GeneXpert MRSA Assay (FDA     approved for NASAL specimens     only), is one component of a     comprehensive  MRSA colonization     surveillance program. It is not     intended to diagnose MRSA     infection nor to guide or     monitor treatment for     MRSA infections.     Studies:  Recent x-ray studies have been reviewed in detail by the Attending Physician       Junious Silk, ANP [facility] Office  [phone] Pager [phone]  **If unable to reach the above provider after paging please contact the Flow Manager @ [phone]  On-Call/Text Page:      [email or web address]      password [email or web address]  If 7PM-7AM, please contact night-coverage [email or web address] Password [email or web address] 07/10/2013, 1:34 PM   LOS: 2 days   I have personally examined this patient and reviewed the entire database. I have reviewed the  above note, made any necessary editorial changes, and agree with its content.  Cherene Altes, MD [facility]

## 2013-07-10 NOTE — Plan of Care (Signed)
Problem: Food- and Nutrition-Related Knowledge Deficit (NB-1.1) Goal: Nutrition education Formal process to instruct or train a patient/client in a skill or to impart knowledge to help patients/clients voluntarily manage or modify food choices and eating behavior to maintain or improve health. Outcome: Completed/Met Date Met:  07/10/13 RD consulted to give Diet Education on weight loss tips and low sodium foods   Ht: 5'2" Wt: 336 lb BMI: 66  63 y.o. year old morbidly obese female with no prior medical follow up presenting with progressive dyspnea, LE swelling and new onset CHF. Pt states that she has had progressive dyspnea and LE swelling over the past 3-4 months. Pt notes that she has had high salt intake to MD. Pt does not have a past medical history significant for diabetes.     Lab Results  Component Value Date    HGBA1C 5.6 07/08/2013   Lipid Panel     Component Value Date/Time    CHOL 115 07/08/2013 2322    TRIG 49 07/08/2013 2322    HDL 48 07/08/2013 2322    CHOLHDL 2.4 07/08/2013 2322    VLDL 10 07/08/2013 2322    LDLCALC 57 07/08/2013 2322    Pt stated that she has never had any education concerning her diet in the past. Pt explained that for the past 3 months she has been doing the [employer], because she felt like she needed to. When asked if she has lost weight over those months, she reported that she didn't know because she did not weigh herself before starting the diet. Pt stated that her biggest struggle is sugar and snacking on sugar throughout the day.   Diet recall:  Breakfast: 2 eggs, [place] bacon Lunch: tunafish on lettuce leaf, cup of broccoli Dinner: Meat, kale and tomatoes   Encouraged patient to eat low sodium foods and went over foods that are high in sodium ([place] bacon, packaged foods, etc.) and to avoid those foods as much as possible. Gave handout "Low Sodium Nutrition Therapy" from the Academy of Nutrition and Dietetics. Also encouraged patient on weight loss  strategies (3 meals day, glass of water before meals, half of plate fruits and vegetables, etc.) and gave handout "Weight loss Tips" and "1800 Calorie Sample Menu" from the Academy of Nutrition and Dietetics. Pt was very interested in information and asked questions about certain types of foods being high in sodium. Pt was knowledgable about MyPlate and could tell me accurate information regarding this. Pt stated that she knows what she needs to do, but struggles with following through with it.   Used teachback. Pt was very appreciative of the information and expressed that the education was very helpful.  Pt is on Heart Healthy Diet eating 100%.  Medications and labs reviewed.   Carrolyn Leigh, BS Nutrition Intern Pager: [phone]

## 2013-07-10 NOTE — Plan of Care (Signed)
Ewen Varnell Barnett RD, LDN Inpatient Clinical Dietitian Pager: [phone]  

## 2013-07-11 DIAGNOSIS — I2789 Other specified pulmonary heart diseases: Secondary | ICD-10-CM

## 2013-07-11 DIAGNOSIS — A5901 Trichomonal vulvovaginitis: Secondary | ICD-10-CM | POA: Diagnosis present

## 2013-07-11 DIAGNOSIS — N390 Urinary tract infection, site not specified: Secondary | ICD-10-CM | POA: Diagnosis present

## 2013-07-11 LAB — URINE CULTURE
COLONY COUNT: NO GROWTH
CULTURE: NO GROWTH

## 2013-07-11 LAB — BASIC METABOLIC PANEL
BUN: 14 mg/dL (ref 6–23)
CHLORIDE: 104 meq/L (ref 96–112)
CO2: 23 meq/L (ref 19–32)
Calcium: 8.8 mg/dL (ref 8.4–10.5)
Creatinine, Ser: 0.96 mg/dL (ref 0.50–1.10)
GFR calc Af Amer: 72 mL/min — ABNORMAL LOW (ref >90)
GFR calc non Af Amer: 62 mL/min — ABNORMAL LOW (ref >90)
Glucose, Bld: 91 mg/dL (ref 70–99)
Potassium: 4.2 mEq/L (ref 3.7–5.3)
Sodium: 141 mEq/L (ref 137–147)

## 2013-07-11 MED ORDER — HYDRALAZINE HCL 20 MG/ML IJ SOLN
5.0000 mg | Freq: Once | INTRAMUSCULAR | Status: AC
  Administered 2013-07-11: 5 mg via INTRAVENOUS

## 2013-07-11 MED ORDER — METRONIDAZOLE 500 MG PO TABS
2000.0000 mg | ORAL_TABLET | Freq: Once | ORAL | Status: AC
  Administered 2013-07-11: 2000 mg via ORAL
  Filled 2013-07-11: qty 4

## 2013-07-11 MED ORDER — CARVEDILOL 3.125 MG PO TABS: 3.1250 mg | ORAL_TABLET | Freq: Two times a day (BID) | ORAL | Status: AC

## 2013-07-11 MED ORDER — CIPROFLOXACIN HCL 500 MG PO TABS: 500.0000 mg | ORAL_TABLET | Freq: Two times a day (BID) | ORAL | Status: DC

## 2013-07-11 MED ORDER — AMLODIPINE BESYLATE 10 MG PO TABS: 10.0000 mg | ORAL_TABLET | Freq: Every day | ORAL | Status: DC

## 2013-07-11 MED ORDER — CIPROFLOXACIN HCL 500 MG PO TABS
500.0000 mg | ORAL_TABLET | Freq: Two times a day (BID) | ORAL | Status: DC
  Administered 2013-07-11: 500 mg via ORAL
  Filled 2013-07-11 (×3): qty 1

## 2013-07-11 NOTE — Progress Notes (Signed)
Notified hospitalist of patient having shortness of breath. Oxygen sats 96-98% on room air but patient states she feels she isn't catching her breath and states abdomen feels tight. Blood pressure 179/96. Applied oxygen per nasal cannula on 1 liter and oxygen sats were 100%. Patient states she felt restless and said she wanted to walk and needed to get out the room. Patient walked with nurse on room air with pulse ox and walked from hall A to hall B and back to the room. Before walking oxygen sats 98% on room air.  Patient took short breaks during walk to catch breath and oxygen sats only dropped to 95-96%. Orders given from the doctor to given 5mg  IV hydralazine and was given per order. Once patient back to bed pt states she felt a little better. Nurse kept oxygen on for comfort at 1liter per nasal cannula. Will continue to monitor to pt to end of shift.

## 2013-07-11 NOTE — Discharge Summary (Signed)
Physician Discharge Summary  Teresa Blackburn ZOX:[id number] DOB: [birth year] DOA: 07/08/2013  PCP: No primary provider on file.  Admit date: 07/08/2013 Discharge date: 07/11/2013  Time spent: 30 minutes  Recommendations for Outpatient Follow-up:  1. Follow up with Dr. Lorenz Coaster for new patient visit on 4/13 2. Follow up with [facility] as new patient on 5/20 3. Weigh daily and call MD for weight gain (see HF discharge instructions) 4. 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 From undiagnosed OSA vs sequelae diastolic HF   Trichomonal vaginitis   Uncomplicated UTI (urinary tract infection)- culture pending at time of discharge   Discharge Condition: stable  Diet recommendation: Heart Healthy- 2 GM sodium  Filed Weights   07/09/13 1451 07/10/13 0445 07/11/13 0522  Weight: 339 lb 1.1 oz (153.8 kg) 336 lb 6.8 oz (152.6 kg) 333 lb 5.4 oz (151.2 kg)    History of present illness:  63 y.o. year old morbidly obese female with no prior medical follow up presenting with progressive dyspnea, LE swelling. Pt states that she has had progressive dyspnea and LE swelling over the past 3-4 months. Has been seen multiple times at local [facility] for cough, dxd with bronchitis. Has had intermittent chest discomfort, orthopnea, and progressive edema. Has had to sleep in a chair for the last 2 months. Has been taking intermittent NSAIDs over this time frame as well as high salt intake. Has not had regular PCP follow up in the past. Denies any dysuria, nausea, vomiting, diarrhea, hemiparesis, confusion, headache, abd pain.   Presented to [facility] with worsening dyspnea. No noted hypoxia. Had CXR c/w CHF.  Pro BNP @ 2500. Trop and EKG WNL. Noted WBC @ 11.7. Cr @ 1 w/ GFR 59. Noted BPs 200s/100s. Received lasix 40mg  IV x1   Hospital Course:  HTN urgency  -BP moderate control at discharge -began CCB 4/7 but may be poor choice if exacerbates LE edema - follow as outpatient -also started on low dose carvedilol this admission   Acute resp failure with hypoxia / Acute grade 2 diastolic CHF exacerbation  -no hypoxia at presentation -lungs clear on exam so dc'd Lasix 4/7  -has diastolic dysfunction - started CCB 4/6 and carvedilol 4/8 - BP better controlled -patient scheduled for OP follow up with [facility] in [facility] after discharge   Acute kidney injury / CKD class 3  -baseline Scr ??- current readings c/w Stage 3  -Scr had bumped since admission therefore no ACE or ARB for now, and holding diuretic  -repeat BMET back down to 0.96 so suspect may have been somewhat over diuresed   Morbid obesity, BMI 55, Class 3  -nutrition counseling for weight reduction strategies and low sodium diet completed this admission  Pulmonary HTN  -new finding of moderate pulmonary HTN  -needs OP PSG   UTI/trichomonas vaginitis -had back pain and dysuria -given 2 GM dose of PO Flagyl on date of discharge -started on Cipro on date of discharge with total duration 3 days  -urine culture pending at time of discharge  Mild malnutrition, encouraged PO intake   Procedures: Transthoracic Echocardiography - Left ventricle: The cavity size  was normal. There was moderate concentric hypertrophy. Systolic function was normal. The estimated ejection fraction was in the range of 50% to 55%. Wall motion was normal; there were no regional wall motion abnormalities. Features are consistent with a pseudonormal left ventricular filling pattern, with concomitant abnormal relaxation and increased filling pressure (grade 2 diastolic dysfunction). Doppler parameters are consistent with elevated ventricular  end-diastolic filling pressure. - Aortic valve: Trileaflet; normal thickness leaflets. No regurgitation. - Mitral valve: Trivial regurgitation. - Left atrium: The atrium was moderately dilated. - Right ventricle: Systolic function was normal. - Tricuspid valve: Mild regurgitation. - Pulmonic valve: No regurgitation. - Pulmonary arteries: Systolic pressure was moderately increased. PA peak pressure: 52mm Hg (S). - Inferior vena cava: The vessel was normal in size. - Pericardium, extracardiac: There was no pericardial effusion.   Consultations:  None  Discharge Exam: Filed Vitals:   07/11/13 0900  BP: 156/84  Pulse: 83  Temp: 98.3 F (36.8 C)  Resp: 18   General: No acute respiratory distress while at rest  Lungs: Clear to auscultation bilaterally without wheezes or crackles, RA - distant breath sounds  Cardiovascular: Regular rate and rhythm without murmur gallop or rub normal S1 and S2  Abdomen: Nontender, nondistended, soft, bowel sounds positive, no rebound, no ascites, no appreciable mass  Musculoskeletal: No significant cyanosis, clubbing of bilateral lower extremities - trace B LE edema  Neurological: Alert and oriented x 3, moves all extremities x 4 without focal neurological deficits, CN 2-12 intact  Discharge Instructions You were cared for by a hospitalist during your hospital stay. If you have any questions about your discharge medications or the care you received while you were in the hospital after you are discharged, you can call the unit and asked to speak with the hospitalist on call if the hospitalist that took care of you is not available. Once you are discharged, your primary care physician will handle any further medical issues. Please note that NO REFILLS for any discharge medications will be authorized once you are discharged, as it is imperative that you return to your primary care physician (or establish a relationship with a primary care physician if you do  not have one) for your aftercare needs so that they can reassess your need for medications and monitor your lab values.      Discharge Orders   Future Appointments Provider Department Dept Phone   08/21/2013 11:00 AM Lewayne Bunting, MD [facility] [phone]   Future Orders Complete By Expires   (HEART FAILURE PATIENTS) Call MD:  Anytime you have any of the following symptoms: 1) 3 pound weight gain in 24 hours or 5 pounds in 1 week 2) shortness of breath, with or without a dry hacking cough 3) swelling in the hands, feet or stomach 4) if you have to sleep on extra pillows at night in order to breathe.  As directed    Call MD for:  extreme fatigue  As directed    Call MD for:  persistant dizziness or light-headedness  As directed    Diet - low sodium heart healthy  As directed    Increase activity slowly  As directed        Medication List         amLODipine 10 MG tablet  Commonly known as:  NORVASC  Take 1 tablet (10 mg total) by mouth daily.     carvedilol 3.125 MG tablet  Commonly known as:  COREG  Take 1 tablet (  3.125 mg total) by mouth 2 (two) times daily with a meal.     ciprofloxacin 500 MG tablet  Commonly known as:  CIPRO  Take 1 tablet (500 mg total) by mouth 2 (two) times daily.     naproxen sodium 220 MG tablet  Commonly known as:  ANAPROX  Take 220 mg by mouth 2 (two) times daily as needed (pain).       Allergies  Allergen Reactions  . Codeine Other (See Comments)    Gives a overwhelming feeling   Follow-up Information   Follow up with Angelica ChessmanAGUIAR,RAFAELA M., MD On 07/15/2013. (Appointment with Dr. Lorenz CoasterKeller with Dr. Rudene ReAguiar's practice as scheduled)    [facility]   Contact information:   [address] [place] [id number] [phone]       [facility] On 08/21/2013. (Heart doctor)        The results of significant diagnostics from this hospitalization (including imaging,  microbiology, ancillary and laboratory) are listed below for reference.    Significant Diagnostic Studies: Dg Chest 2 View  07/08/2013   CLINICAL DATA:  Shortness of breath  EXAM: CHEST  2 VIEW  COMPARISON:  None.  FINDINGS: Cardiac shadow is at the upper limits of normal in size. Mild vascular congestion is noted as well as some patchy infiltrative changes bilaterally. This likely represents a component of congestive failure. Correlation with the clinical exam is recommended. No bony abnormality is noted.  IMPRESSION: Bilateral changes likely related to congestive failure.   Electronically Signed   By: Alcide CleverMark  Lukens M.D.   On: 07/08/2013 08:31    Microbiology: Recent Results (from the past 240 hour(s))  MRSA PCR SCREENING     Status: None   Collection Time    07/08/13  9:54 PM      Result Value Ref Range Status   MRSA by PCR NEGATIVE  NEGATIVE Final   Comment:            The GeneXpert MRSA Assay (FDA     approved for NASAL specimens     only), is one component of a     comprehensive MRSA colonization     surveillance program. It is not     intended to diagnose MRSA     infection nor to guide or     monitor treatment for     MRSA infections.     Labs: Basic Metabolic Panel:  Recent Labs Lab 07/08/13 0920 07/08/13 2332 07/09/13 0408 07/10/13 0444 07/11/13 0559  NA 145  --  145 144 141  K 3.8  --  3.8 4.1 4.2  CL 106  --  105 105 104  CO2 27  --  27 26 23   GLUCOSE 128*  --  99 92 91  BUN 17  --  16 21 14   CREATININE 1.00 0.91 1.28* 1.27* 0.96  CALCIUM 9.6  --  9.0 8.6 8.8   Liver Function Tests:  Recent Labs Lab 07/09/13 0408 07/10/13 0444  AST 33 30  ALT 36* 36*  ALKPHOS 56 53  BILITOT 0.6 0.4  PROT 6.6 6.6  ALBUMIN 3.1* 3.0*   No results found for this basename: LIPASE, AMYLASE,  in the last 168 hours No results found for this basename: AMMONIA,  in the last 168 hours CBC:  Recent Labs Lab 07/08/13 0920 07/08/13 2332 07/09/13 0408 07/10/13 0444  WBC  11.7* 11.7* 10.9* 7.7  NEUTROABS  --   --  8.1* 3.7  HGB 12.1 11.4* 10.7* 11.3*  HCT 37.6 34.9* 33.2* 34.5*  MCV 91.7 90.2 91.0 91.0  PLT 258 245 246 231   Cardiac Enzymes:  Recent Labs Lab 07/08/13 0920 07/08/13 2322 07/09/13 0408 07/09/13 1040  TROPONINI <0.30 <0.30 <0.30 <0.30   BNP: BNP (last 3 results)  Recent Labs  07/08/13 0920  PROBNP 2519.0*   CBG: No results found for this basename: GLUCAP,  in the last 168 hours     Signed:  Russella Dar ANP [facility] 07/11/2013, 10:46 AM  Patient is seen examined, please seen above for assessment and plans  Esperanza Sheets

## 2013-07-11 NOTE — Discharge Instructions (Signed)
DASH Diet °The DASH diet stands for "Dietary Approaches to Stop Hypertension." It is a healthy eating plan that has been shown to reduce high blood pressure (hypertension) in as little as 14 days, while also possibly providing other significant health benefits. These other health benefits include reducing the risk of breast cancer after menopause and reducing the risk of type 2 diabetes, heart disease, colon cancer, and stroke. Health benefits also include weight loss and slowing kidney failure in patients with chronic kidney disease.  °DIET GUIDELINES °· Limit salt (sodium). Your diet should contain less than 1500 mg of sodium daily. °· Limit refined or processed carbohydrates. Your diet should include mostly whole grains. Desserts and added sugars should be used sparingly. °· Include small amounts of heart-healthy fats. These types of fats include nuts, oils, and tub margarine. Limit saturated and trans fats. These fats have been shown to be harmful in the body. °CHOOSING FOODS  °The following food groups are based on a 2000 calorie diet. See your Registered Dietitian for individual calorie needs. °Grains and Grain Products (6 to 8 servings daily) °· Eat More Often: Whole-wheat bread, brown rice, whole-grain or wheat pasta, quinoa, popcorn without added fat or salt (air popped). °· Eat Less Often: White bread, white pasta, white rice, cornbread. °Vegetables (4 to 5 servings daily) °· Eat More Often: Fresh, frozen, and canned vegetables. Vegetables may be raw, steamed, roasted, or grilled with a minimal amount of fat. °· Eat Less Often/Avoid: Creamed or fried vegetables. Vegetables in a cheese sauce. °Fruit (4 to 5 servings daily) °· Eat More Often: All fresh, canned (in natural juice), or frozen fruits. Dried fruits without added sugar. One hundred percent fruit juice (½ cup [237 mL] daily). °· Eat Less Often: Dried fruits with added sugar. Canned fruit in light or heavy syrup. °Lean Meats, Fish, and Poultry (2  servings or less daily. One serving is 3 to 4 oz [85-114 g]). °· Eat More Often: Ninety percent or leaner ground beef, tenderloin, sirloin. Round cuts of beef, chicken breast, turkey breast. All fish. Grill, bake, or broil your meat. Nothing should be fried. °· Eat Less Often/Avoid: Fatty cuts of meat, turkey, or chicken leg, thigh, or wing. Fried cuts of meat or fish. °Dairy (2 to 3 servings) °· Eat More Often: Low-fat or fat-free milk, low-fat plain or light yogurt, reduced-fat or part-skim cheese. °· Eat Less Often/Avoid: Milk (whole, 2%). Whole milk yogurt. Full-fat cheeses. °Nuts, Seeds, and Legumes (4 to 5 servings per week) °· Eat More Often: All without added salt. °· Eat Less Often/Avoid: Salted nuts and seeds, canned beans with added salt. °Fats and Sweets (limited) °· Eat More Often: Vegetable oils, tub margarines without trans fats, sugar-free gelatin. Mayonnaise and salad dressings. °· Eat Less Often/Avoid: Coconut oils, palm oils, butter, stick margarine, cream, half and half, cookies, candy, pie. °FOR MORE INFORMATION °The Dash Diet Eating Plan: www.dashdiet.org °Document Released: 03/10/2011 Document Revised: 06/13/2011 Document Reviewed: 03/10/2011 °[employer]® Patient Information ©2014 [employer]. ° °Arterial Hypertension °Arterial hypertension (high blood pressure) is a condition of elevated pressure in your blood vessels. Hypertension over a long period of time is a risk factor for strokes, heart attacks, and heart failure. It is also the leading cause of kidney (renal) failure.  °CAUSES  °· In Adults -- Over 90% of all hypertension has no known cause. This is called essential or primary hypertension. In the other 10% of people with hypertension, the increase in blood pressure is caused by another disorder. This   is called secondary hypertension. Important causes of secondary hypertension are: °· Heavy alcohol use. °· Obstructive sleep apnea. °· Hyperaldosterosim (Conn's syndrome). °· Steroid  use. °· Chronic kidney failure. °· Hyperparathyroidism. °· Medications. °· Renal artery stenosis. °· Pheochromocytoma. °· Cushing's disease. °· Coarctation of the aorta. °· Scleroderma renal crisis. °· Licorice (in excessive amounts). °· Drugs (cocaine, methamphetamine). °Your caregiver can explain any items above that apply to you. °· In Children -- Secondary hypertension is more common and should always be considered. °· Pregnancy -- Few women of childbearing age have high blood pressure. However, up to 10% of them develop hypertension of pregnancy. Generally, this will not harm the woman. It may be a sign of 3 complications of pregnancy: preeclampsia, HELLP syndrome, and eclampsia. Follow up and control with medication is necessary. °SYMPTOMS  °· This condition normally does not produce any noticeable symptoms. It is usually found during a routine exam. °· Malignant hypertension is a late problem of high blood pressure. It may have the following symptoms: °· Headaches. °· Blurred vision. °· End-organ damage (this means your kidneys, heart, lungs, and other organs are being damaged). °· Stressful situations can increase the blood pressure. If a person with normal blood pressure has their blood pressure go up while being seen by their caregiver, this is often termed "white coat hypertension." Its importance is not known. It may be related with eventually developing hypertension or complications of hypertension. °· Hypertension is often confused with mental tension, stress, and anxiety. °DIAGNOSIS  °The diagnosis is made by 3 separate blood pressure measurements. They are taken at least 1 week apart from each other. If there is organ damage from hypertension, the diagnosis may be made without repeat measurements. °Hypertension is usually identified by having blood pressure readings: °· Above 140/90 mmHg measured in both arms, at 3 separate times, over a couple weeks. °· Over 130/80 mmHg should be considered a risk  factor and may require treatment in patients with diabetes. °Blood pressure readings over 120/80 mmHg are called "pre-hypertension" even in non-diabetic patients. °To get a true blood pressure measurement, use the following guidelines. Be aware of the factors that can alter blood pressure readings. °· Take measurements at least 1 hour after caffeine. °· Take measurements 30 minutes after smoking and without any stress. This is another reason to quit smoking  it raises your blood pressure. °· Use a proper cuff size. Ask your caregiver if you are not sure about your cuff size. °· Most home blood pressure cuffs are automatic. They will measure systolic and diastolic pressures. The systolic pressure is the pressure reading at the start of sounds. Diastolic pressure is the pressure at which the sounds disappear. If you are elderly, measure pressures in multiple postures. Try sitting, lying or standing. °· Sit at rest for a minimum of 5 minutes before taking measurements. °· You should not be on any medications like decongestants. These are found in many cold medications. °· Record your blood pressure readings and review them with your caregiver. °If you have hypertension: °· Your caregiver may do tests to be sure you do not have secondary hypertension (see "causes" above). °· Your caregiver may also look for signs of metabolic syndrome. This is also called Syndrome X or Insulin Resistance Syndrome. You may have this syndrome if you have type 2 diabetes, abdominal obesity, and abnormal blood lipids in addition to hypertension. °· Your caregiver will take your medical and family history and perform a physical exam. °· Diagnostic tests   may include blood tests (for glucose, cholesterol, potassium, and kidney function), a urinalysis, or an EKG. Other tests may also be necessary depending on your condition. °PREVENTION  °There are important lifestyle issues that you can adopt to reduce your chance of developing  hypertension: °· Maintain a normal weight. °· Limit the amount of salt (sodium) in your diet. °· Exercise often. °· Limit alcohol intake. °· Get enough potassium in your diet. Discuss specific advice with your caregiver. °· Follow a DASH diet (dietary approaches to stop hypertension). This diet is rich in fruits, vegetables, and low-fat dairy products, and avoids certain fats. °PROGNOSIS  °Essential hypertension cannot be cured. Lifestyle changes and medical treatment can lower blood pressure and reduce complications. The prognosis of secondary hypertension depends on the underlying cause. Many people whose hypertension is controlled with medicine or lifestyle changes can live a normal, healthy life.  °RISKS AND COMPLICATIONS  °While high blood pressure alone is not an illness, it often requires treatment due to its short- and long-term effects on many organs. Hypertension increases your risk for: °· CVAs or strokes (cerebrovascular accident). °· Heart failure due to chronically high blood pressure (hypertensive cardiomyopathy). °· Heart attack (myocardial infarction). °· Damage to the retina (hypertensive retinopathy). °· Kidney failure (hypertensive nephropathy). °Your caregiver can explain list items above that apply to you. Treatment of hypertension can significantly reduce the risk of complications. °TREATMENT  °· For overweight patients, weight loss and regular exercise are recommended. Physical fitness lowers blood pressure. °· Mild hypertension is usually treated with diet and exercise. A diet rich in fruits and vegetables, fat-free dairy products, and foods low in fat and salt (sodium) can help lower blood pressure. Decreasing salt intake decreases blood pressure in a 1/3 of people. °· Stop smoking if you are a smoker. °The steps above are highly effective in reducing blood pressure. While these actions are easy to suggest, they are difficult to achieve. Most patients with moderate or severe hypertension  end up requiring medications to bring their blood pressure down to a normal level. There are several classes of medications for treatment. Blood pressure pills (antihypertensives) will lower blood pressure by their different actions. Lowering the blood pressure by 10 mmHg may decrease the risk of complications by as much as 25%. °The goal of treatment is effective blood pressure control. This will reduce your risk for complications. Your caregiver will help you determine the best treatment for you according to your lifestyle. What is excellent treatment for one person, may not be for you. °HOME CARE INSTRUCTIONS  °· Do not smoke. °· Follow the lifestyle changes outlined in the "Prevention" section. °· If you are on medications, follow the directions carefully. Blood pressure medications must be taken as prescribed. Skipping doses reduces their benefit. It also puts you at risk for problems. °· Follow up with your caregiver, as directed. °· If you are asked to monitor your blood pressure at home, follow the guidelines in the "Diagnosis" section above. °SEEK MEDICAL CARE IF:  °· You think you are having medication side effects. °· You have recurrent headaches or lightheadedness. °· You have swelling in your ankles. °· You have trouble with your vision. °SEEK IMMEDIATE MEDICAL CARE IF:  °· You have sudden onset of chest pain or pressure, difficulty breathing, or other symptoms of a heart attack. °· You have a severe headache. °· You have symptoms of a stroke (such as sudden weakness, difficulty speaking, difficulty walking). °MAKE SURE YOU:  °· Understand these instructions. °·   Will watch your condition.  Will get help right away if you are not doing well or get worse. Document Released: 03/21/2005 Document Revised: 06/13/2011 Document Reviewed: 10/19/2006 [facility] Patient Information 2014 [place], [place].  Heart Failure Heart failure is a condition in which the heart has trouble pumping blood. This means your  heart does not pump blood efficiently for your body to work well. In some cases of heart failure, fluid may back up into your lungs or you may have swelling (edema) in your lower legs. Heart failure is usually a long-term (chronic) condition. It is important for you to take good care of yourself and follow your caregiver's treatment plan. CAUSES  Some health conditions can cause heart failure. Those health conditions include:  High blood pressure (hypertension) causes the heart muscle to work harder than normal. When pressure in the blood vessels is high, the heart needs to pump (contract) with more force in order to circulate blood throughout the body. High blood pressure eventually causes the heart to become stiff and weak.  Coronary artery disease (CAD) is the buildup of cholesterol and fat (plaque) in the arteries of the heart. The blockage in the arteries deprives the heart muscle of oxygen and blood. This can cause chest pain and may lead to a heart attack. High blood pressure can also contribute to CAD.  Heart attack (myocardial infarction) occurs when 1 or more arteries in the heart become blocked. The loss of oxygen damages the muscle tissue of the heart. When this happens, part of the heart muscle dies. The injured tissue does not contract as well and weakens the heart's ability to pump blood.  Abnormal heart valves can cause heart failure when the heart valves do not open and close properly. This makes the heart muscle pump harder to keep the blood flowing.  Heart muscle disease (cardiomyopathy or myocarditis) is damage to the heart muscle from a variety of causes. These can include drug or alcohol abuse, infections, or unknown reasons. These can increase the risk of heart failure.  Lung disease makes the heart work harder because the lungs do not work properly. This can cause a strain on the heart, leading it to fail.  Diabetes increases the risk of heart failure. High blood sugar  contributes to high fat (lipid) levels in the blood. Diabetes can also cause slow damage to tiny blood vessels that carry important nutrients to the heart muscle. When the heart does not get enough oxygen and food, it can cause the heart to become weak and stiff. This leads to a heart that does not contract efficiently.  Other conditions can contribute to heart failure. These include abnormal heart rhythms, thyroid problems, and low blood counts (anemia). Certain unhealthy behaviors can increase the risk of heart failure. Those unhealthy behaviors include:  Being overweight.  Smoking or chewing tobacco.  Eating foods high in fat and cholesterol.  Abusing illicit drugs or alcohol.  Lacking physical activity. SYMPTOMS  Heart failure symptoms may vary and can be hard to detect. Symptoms may include:  Shortness of breath with activity, such as climbing stairs.  Persistent cough.  Swelling of the feet, ankles, legs, or abdomen.  Unexplained weight gain.  Difficulty breathing when lying flat (orthopnea).  Waking from sleep because of the need to sit up and get more air.  Rapid heartbeat.  Fatigue and loss of energy.  Feeling lightheaded, dizzy, or close to fainting.  Loss of appetite.  Nausea.  Increased urination during the night (nocturia). DIAGNOSIS  A diagnosis of heart failure is based on your history, symptoms, physical examination, and diagnostic tests. Diagnostic tests for heart failure may include:  Echocardiography.  Electrocardiography.  Chest X-ray.  Blood tests.  Exercise stress test.  Cardiac angiography.  Radionuclide scans. TREATMENT  Treatment is aimed at managing the symptoms of heart failure. Medicines, behavioral changes, or surgical intervention may be necessary to treat heart failure.  Medicines to help treat heart failure may include:  Angiotensin-converting enzyme (ACE) inhibitors. This type of medicine blocks the effects of a blood  protein called angiotensin-converting enzyme. ACE inhibitors relax (dilate) the blood vessels and help lower blood pressure.  Angiotensin receptor blockers. This type of medicine blocks the actions of a blood protein called angiotensin. Angiotensin receptor blockers dilate the blood vessels and help lower blood pressure.  Water pills (diuretics). Diuretics cause the kidneys to remove salt and water from the blood. The extra fluid is removed through urination. This loss of extra fluid lowers the volume of blood the heart pumps.  Beta blockers. These prevent the heart from beating too fast and improve heart muscle strength.  Digitalis. This increases the force of the heartbeat.  Healthy behavior changes include:  Obtaining and maintaining a healthy weight.  Stopping smoking or chewing tobacco.  Eating heart healthy foods.  Limiting or avoiding alcohol.  Stopping illicit drug use.  Physical activity as directed by your caregiver.  Surgical treatment for heart failure may include:  A procedure to open blocked arteries, repair damaged heart valves, or remove damaged heart muscle tissue.  A pacemaker to improve heart muscle function and control certain abnormal heart rhythms.  An internal cardioverter defibrillator to treat certain serious abnormal heart rhythms.  A left ventricular assist device to assist the pumping ability of the heart. HOME CARE INSTRUCTIONS   Take your medicine as directed by your caregiver. Medicines are important in reducing the workload of your heart, slowing the progression of heart failure, and improving your symptoms.  Do not stop taking your medicine unless directed by your caregiver.  Do not skip any dose of medicine.  Refill your prescriptions before you run out of medicine. Your medicines are needed every day.  Take over-the-counter medicine only as directed by your caregiver or pharmacist.  Engage in moderate physical activity if directed by  your caregiver. Moderate physical activity can benefit some people. The elderly and people with severe heart failure should consult with a caregiver for physical activity recommendations.  Eat heart healthy foods. Food choices should be free of trans fat and low in saturated fat, cholesterol, and salt (sodium). Healthy choices include fresh or frozen fruits and vegetables, fish, lean meats, legumes, fat-free or low-fat dairy products, and whole grain or high fiber foods. Talk to a dietitian to learn more about heart healthy foods.  Limit sodium if directed by your caregiver. Sodium restriction may reduce symptoms of heart failure in some people. Talk to a dietitian to learn more about heart healthy seasonings.  Use healthy cooking methods. Healthy cooking methods include roasting, grilling, broiling, baking, poaching, steaming, or stir-frying. Talk to a dietitian to learn more about healthy cooking methods.  Limit fluids if directed by your caregiver. Fluid restriction may reduce symptoms of heart failure in some people.  Weigh yourself every day. Daily weights are important in the early recognition of excess fluid. You should weigh yourself every morning after you urinate and before you eat breakfast. Wear the same amount of clothing each time you weigh yourself. Record your  daily weight. Provide your caregiver with your weight record.  Monitor and record your blood pressure if directed by your caregiver.  Check your pulse if directed by your caregiver.  Lose weight if directed by your caregiver. Weight loss may reduce symptoms of heart failure in some people.  Stop smoking or chewing tobacco. Nicotine makes your heart work harder by causing your blood vessels to constrict. Do not use nicotine gum or patches before talking to your caregiver.  Schedule and attend follow-up visits as directed by your caregiver. It is important to keep all your appointments.  Limit alcohol intake to no more than  1 drink per day for nonpregnant women and 2 drinks per day for men. Drinking more than that is harmful to your heart. Tell your caregiver if you drink alcohol several times a week. Talk with your caregiver about whether alcohol is safe for you. If your heart has already been damaged by alcohol or you have severe heart failure, drinking alcohol should be stopped completely.  Stop illicit drug use.  Stay up-to-date with immunizations. It is especially important to prevent respiratory infections through current pneumococcal and influenza immunizations.  Manage other health conditions such as hypertension, diabetes, thyroid disease, or abnormal heart rhythms as directed by your caregiver.  Learn to manage stress.  Plan rest periods when fatigued.  Learn strategies to manage high temperatures. If the weather is extremely hot:  Avoid vigorous physical activity.  Use air conditioning or fans or seek a cooler location.  Avoid caffeine and alcohol.  Wear loose-fitting, lightweight, and light-colored clothing.  Learn strategies to manage cold temperatures. If the weather is extremely cold:  Avoid vigorous physical activity.  Layer clothes.  Wear mittens or gloves, a hat, and a scarf when going outside.  Avoid alcohol.  Obtain ongoing education and support as needed.  Participate or seek rehabilitation as needed to maintain or improve independence and quality of life. SEEK MEDICAL CARE IF:   Your weight increases by 03 lb/1.4 kg in 1 day or 05 lb/2.3 kg in a week.  You have increasing shortness of breath that is unusual for you.  You are unable to participate in your usual physical activities.  You tire easily.  You cough more than normal, especially with physical activity.  You have any or more swelling in areas such as your hands, feet, ankles, or abdomen.  You are unable to sleep because it is hard to breathe.  You feel like your heart is beating fast (palpitations).  You  become dizzy or lightheaded upon standing up. SEEK IMMEDIATE MEDICAL CARE IF:   You have difficulty breathing.  There is a change in mental status such as decreased alertness or difficulty with concentration.  You have a pain or discomfort in your chest.  You have an episode of fainting (syncope). MAKE SURE YOU:   Understand these instructions.  Will watch your condition.  Will get help right away if you are not doing well or get worse. Document Released: 03/21/2005 Document Revised: 07/16/2012 Document Reviewed: 04/12/2012 [facility] Patient Information 2014 [place], [place].  How to Take Your Blood Pressure  These instructions are only for electronic home blood pressure machines. You will need:   An automatic or semi-automatic blood pressure machine.  Fresh batteries for the blood pressure machine. HOW DO I USE THESE TOOLS TO CHECK MY BLOOD PRESSURE?   There are 2 numbers that make up your blood pressure. For example: 120/80.  The first number (120 in our example)  is called the "systolic pressure." It is a measure of the pressure in your blood vessels when your heart is pumping blood.  The second number (80 in our example) is called the "diastolic pressure." It is a measure of the pressure in your blood vessels when your heart is resting between beats.  Before you buy a home blood pressure machine, check the size of your arm so you can buy the right size cuff. Here is how to check the size of your arm:  Use a tape measure that shows both inches and centimeters.  Wrap the tape measure around the middle upper part of your arm. You may need someone to help you measure right.  Write down your arm measurement in both inches and centimeters.  To measure your blood pressure right, it is important to have the right size cuff.  If your arm is up to 13 inches (37 to 34 centimeters), get an adult cuff size.  If your arm is 13 to 17 inches (35 to 44 centimeters), get a large adult  cuff size.  If your arm is 17 to 20 inches (45 to 52 centimeters), get an adult thigh cuff.  Try to rest or relax for at least 30 minutes before you check your blood pressure.  Do not smoke.  Do not have any drinks with caffeine, such as:  Pop.  Coffee.  Tea.  Check your blood pressure in a quiet room.  Sit down and stretch out your arm on a table. Keep your arm at about the level of your heart. Let your arm relax. GETTING BLOOD PRESSURE READINGS  Make sure you remove any tight-fighting clothing from your arm. Wrap the cuff around your upper arm. Wrap it just above the bend, and above where you felt the pulse. You should be able to slip a finger between the cuff and your arm. If you cannot slip a finger in the cuff, it is too tight and should be removed and rewrapped.  Some units requires you to manually pump up the arm cuff.  Automatic units inflate the cuff when you press a button.  Cuff deflation is automatic in both models.  After the cuff is inflated, the unit measures your blood pressure and pulse. The readings are displayed on a monitor. Hold still and breathe normally while the cuff is inflated.  Getting a reading takes less than a minute.  Some models store readings in a memory. Some provide a printout of readings.  Get readings at different times of the day. You should wait at least 5 minutes between readings. Take readings with you to your next doctor's visit. Document Released: 03/03/2008 Document Revised: 06/13/2011 Document Reviewed: 03/03/2008 [facility] Patient Information 2014 [place], [place]. Trichomoniasis Trichomoniasis is an infection, caused by the Trichomonas organism, that affects both women and men. In women, the outer female genitalia and the vagina are affected. In men, the penis is mainly affected, but the prostate and other reproductive organs can also be involved. Trichomoniasis is a sexually transmitted disease (STD) and is most often passed to  another person through sexual contact. The majority of people who get trichomoniasis do so from a sexual encounter and are also at risk for other STDs. CAUSES   Sexual intercourse with an infected partner.  It can be present in swimming pools or hot tubs. SYMPTOMS   Abnormal gray-green frothy vaginal discharge in women.  Vaginal itching and irritation in women.  Itching and irritation of the area outside the vagina  in women.  Penile discharge with or without pain in males.  Inflammation of the urethra (urethritis), causing painful urination.  Bleeding after sexual intercourse. RELATED COMPLICATIONS  Pelvic inflammatory disease.  Infection of the uterus (endometritis).  Infertility.  Tubal (ectopic) pregnancy.  It can be associated with other STDs, including gonorrhea and chlamydia, hepatitis B, and HIV. COMPLICATIONS DURING PREGNANCY  Early (premature) delivery.  Premature rupture of the membranes (PROM).  Low birth weight. DIAGNOSIS   Visualization of Trichomonas under the microscope from the vagina discharge.  Ph of the vagina greater than 4.5, tested with a test tape.  Trich Rapid Test.  Culture of the organism, but this is not usually needed.  It may be found on a Pap test.  Having a "strawberry cervix,"which means the cervix looks very red like a strawberry. TREATMENT   You may be given medication to fight the infection. Inform your caregiver if you could be or are pregnant. Some medications used to treat the infection should not be taken during pregnancy.  Over-the-counter medications or creams to decrease itching or irritation may be recommended.  Your sexual partner will need to be treated if infected. HOME CARE INSTRUCTIONS   Take all medication prescribed by your caregiver.  Take over-the-counter medication for itching or irritation as directed by your caregiver.  Do not have sexual intercourse while you have the infection.  Do not douche or  wear tampons.  Discuss your infection with your partner, as your partner may have acquired the infection from you. Or, your partner may have been the person who transmitted the infection to you.  Have your sex partner examined and treated if necessary.  Practice safe, informed, and protected sex.  See your caregiver for other STD testing. SEEK MEDICAL CARE IF:   You still have symptoms after you finish the medication.  You have an oral temperature above 102 F (38.9 C).  You develop belly (abdominal) pain.  You have pain when you urinate.  You have bleeding after sexual intercourse.  You develop a rash.  The medication makes you sick or makes you throw up (vomit). Document Released: 09/14/2000 Document Revised: 06/13/2011 Document Reviewed: 10/10/2008 [facility] Patient Information 2014 [place]. Urinary Tract Infection Urinary tract infections (UTIs) can develop anywhere along your urinary tract. Your urinary tract is your body's drainage system for removing wastes and extra water. Your urinary tract includes two kidneys, two ureters, a bladder, and a urethra. Your kidneys are a pair of bean-shaped organs. Each kidney is about the size of your fist. They are located below your ribs, one on each side of your spine. CAUSES Infections are caused by microbes, which are microscopic organisms, including fungi, viruses, and bacteria. These organisms are so small that they can only be seen through a microscope. Bacteria are the microbes that most commonly cause UTIs. SYMPTOMS  Symptoms of UTIs may vary by age and gender of the patient and by the location of the infection. Symptoms in young women typically include a frequent and intense urge to urinate and a painful, burning feeling in the bladder or urethra during urination. Older women and men are more likely to be tired, shaky, and weak and have muscle aches and abdominal pain. A fever may mean the infection is in your kidneys. Other  symptoms of a kidney infection include pain in your back or sides below the ribs, nausea, and vomiting. DIAGNOSIS To diagnose a UTI, your caregiver will ask you about your symptoms. Your caregiver also will  ask to provide a urine sample. The urine sample will be tested for bacteria and white blood cells. White blood cells are made by your body to help fight infection. TREATMENT  Typically, UTIs can be treated with medication. Because most UTIs are caused by a bacterial infection, they usually can be treated with the use of antibiotics. The choice of antibiotic and length of treatment depend on your symptoms and the type of bacteria causing your infection. HOME CARE INSTRUCTIONS  If you were prescribed antibiotics, take them exactly as your caregiver instructs you. Finish the medication even if you feel better after you have only taken some of the medication.  Drink enough water and fluids to keep your urine clear or pale yellow.  Avoid caffeine, tea, and carbonated beverages. They tend to irritate your bladder.  Empty your bladder often. Avoid holding urine for long periods of time.  Empty your bladder before and after sexual intercourse.  After a bowel movement, women should cleanse from front to back. Use each tissue only once. SEEK MEDICAL CARE IF:   You have back pain.  You develop a fever.  Your symptoms do not begin to resolve within 3 days. SEEK IMMEDIATE MEDICAL CARE IF:   You have severe back pain or lower abdominal pain.  You develop chills.  You have nausea or vomiting.  You have continued burning or discomfort with urination. MAKE SURE YOU:   Understand these instructions.  Will watch your condition.  Will get help right away if you are not doing well or get worse. Document Released: 12/29/2004 Document Revised: 09/20/2011 Document Reviewed: 04/29/2011 [facility] Patient Information 2014 [place], [place]. Pulmonary Hypertension Pulmonary hypertension is high blood  pressure within the arteries in your lungs (pulmonary arteries). It is different than having high blood pressure elsewhere in your body, such as blood pressure that is measured with a blood pressure cuff. Pulmonary hypertension makes it harder for blood to flow through the lungs. As a result, the heart must work harder to pump blood through the lungs, and it may be harder for you to breathe. Over time, this can weaken the heart muscle. Pulmonary hypertension is a serious condition and can be fatal.  CAUSES Many different medical conditions can cause pulmonary hypertension. They can be grouped into five different categories:  Group 1 Pulmonary hypertension caused by abnormal growth of small blood vessels in the lungs (pulmonary arterial hypertension). The abnormal blood vessel growth may have no known cause or may be:   Hereditary.  Caused by another disease such as a connective tissue disease (including lupus or scleroderma) or HIV.  Caused by certain drugs or toxins. Group 2 Pulmonary hypertension caused by weakness of the main chamber of the heart (left ventricle) or heart valve disease. Group 3 Pulmonary hypertension caused by lung disease or low oxygen levels. Causes in this group include:  Emphysema or chronic obstructive pulmonary disease (COPD).  Untreated sleep apnea.  Pulmonary fibrosis. Group 4 Pulmonary hypertension caused by blood clots in the lungs (pulmonary emboli).  Group 5 Other causes of pulmonary hypertension, such as sickle cell anemia, or a mix between multiple causes. SIGNS AND SYMPTOMS  Shortness of breath. You may notice shortness of breath with:  Activity such as walking.  No activity.  Tiredness and fatigue.  Dizziness or fainting.  Rapid heartbeat or feeling the heart flutter or skip a beat (palpitations).  Neck vein enlargement.  Bluish color to the lips and fingertips. DIAGNOSIS  Various tests may be used to  help diagnose pulmonary hypertension.  These can include:  Chest X-ray.  Arterial blood gases. This test checks the oxygen level in your blood.  CT scans. This test can provide detailed images of your lungs.  Pulmonary function test. This test measures how much air your lungs can hold. It also tests how well air moves in and out of your lungs.  Electrocardiography. This test traces the electrical activity of your heart.  Echocardiography. This test is used to look at your heart in motion and how it functions.  Heart catheterization. This test can measure the pressure in your pulmonary artery and the right side of the heart.  Lung biopsy. A tissue sample is sometimes taken from the lung to check for underlying causes. TREATMENT Pulmonary hypertension has no cure. Treatment is to help relieve symptoms and slow the progress of the condition. Treatment can involve:  Medicines such as:  Blood pressure medicines.  Medicines to relax (dilate) the pulmonary blood vessels.  Diuretic medicines.  Blood thinning medicines.  Surgery. For severe pulmonary hypertension that does not respond to medical treatment, heart-lung or lung transplant may be needed. HOME CARE INSTRUCTIONS  Only take over-the-counter or prescription medicines as directed by your health care provider. Take all medicines exactly as instructed. Do not change or stop medicines without first checking with your health care provider.  Do not smoke.  Eat a healthy diet. Decrease how much salt (sodium) you eat by checking nutrition labels and using seasonings without salt. Talk to your health care provider or a dietitian about foods you should eat.  Stay as active as possible. Exercise as directed by your health care provider. Talk to your health care provider about what type of exercise is safe for you.  Avoid high altitudes.  Avoid hot tubs and saunas.  Avoid becoming pregnant, if this applies. Talk to your health care provider about safe methods of birth  control.  Follow up with your health care provider as directed. SEEK IMMEDIATE MEDICAL CARE IF:  You have severe shortness of breath.  You develop chest pain or pressure.  You cough up blood.  You develop swelling of your feet or legs.  You have a significant increase in weight over 1 2 days. Document Released: 01/16/2007 Document Revised: 01/09/2013 Document Reviewed: 09/10/2012 [facility] Patient Information 2014 [place], [place].

## 2013-07-11 NOTE — Progress Notes (Signed)
All d/c instructions explained as given Pt verbalized understanding.  D/c off floor @1315  via w/c by assigned NT.  Amanda PeaNellie Anie Juniel, Charity fundraiserN.

## 2013-08-21 ENCOUNTER — Ambulatory Visit: Payer: BC Managed Care – PPO | Admitting: Cardiology

## 2016-03-10 DIAGNOSIS — E042 Nontoxic multinodular goiter: Secondary | ICD-10-CM | POA: Diagnosis not present

## 2016-03-10 DIAGNOSIS — R0600 Dyspnea, unspecified: Secondary | ICD-10-CM | POA: Diagnosis not present

## 2016-03-10 DIAGNOSIS — R05 Cough: Secondary | ICD-10-CM | POA: Diagnosis not present

## 2016-03-10 DIAGNOSIS — I272 Pulmonary hypertension, unspecified: Secondary | ICD-10-CM | POA: Diagnosis not present

## 2016-03-10 DIAGNOSIS — I5032 Chronic diastolic (congestive) heart failure: Secondary | ICD-10-CM | POA: Diagnosis not present

## 2016-03-10 DIAGNOSIS — R0602 Shortness of breath: Secondary | ICD-10-CM | POA: Diagnosis not present

## 2016-03-10 DIAGNOSIS — R062 Wheezing: Secondary | ICD-10-CM | POA: Diagnosis not present

## 2016-03-10 DIAGNOSIS — R600 Localized edema: Secondary | ICD-10-CM | POA: Diagnosis not present

## 2016-03-10 DIAGNOSIS — E040 Nontoxic diffuse goiter: Secondary | ICD-10-CM | POA: Diagnosis not present

## 2016-03-10 DIAGNOSIS — I10 Essential (primary) hypertension: Secondary | ICD-10-CM | POA: Diagnosis not present

## 2016-03-10 DIAGNOSIS — Z6841 Body Mass Index (BMI) 40.0 and over, adult: Secondary | ICD-10-CM | POA: Diagnosis not present

## 2016-03-19 DIAGNOSIS — Z79899 Other long term (current) drug therapy: Secondary | ICD-10-CM | POA: Diagnosis not present

## 2016-07-04 DIAGNOSIS — I5032 Chronic diastolic (congestive) heart failure: Secondary | ICD-10-CM | POA: Diagnosis not present

## 2016-07-04 DIAGNOSIS — I10 Essential (primary) hypertension: Secondary | ICD-10-CM | POA: Diagnosis not present

## 2016-08-03 DIAGNOSIS — G4733 Obstructive sleep apnea (adult) (pediatric): Secondary | ICD-10-CM | POA: Diagnosis not present

## 2016-08-14 DIAGNOSIS — Z6841 Body Mass Index (BMI) 40.0 and over, adult: Secondary | ICD-10-CM | POA: Diagnosis not present

## 2016-08-14 DIAGNOSIS — R0602 Shortness of breath: Secondary | ICD-10-CM | POA: Diagnosis not present

## 2016-08-14 DIAGNOSIS — R0609 Other forms of dyspnea: Secondary | ICD-10-CM | POA: Diagnosis not present

## 2016-08-14 DIAGNOSIS — R05 Cough: Secondary | ICD-10-CM | POA: Diagnosis not present

## 2016-08-14 DIAGNOSIS — J40 Bronchitis, not specified as acute or chronic: Secondary | ICD-10-CM | POA: Diagnosis not present

## 2016-08-14 DIAGNOSIS — J0100 Acute maxillary sinusitis, unspecified: Secondary | ICD-10-CM | POA: Diagnosis not present

## 2016-11-10 DIAGNOSIS — G4733 Obstructive sleep apnea (adult) (pediatric): Secondary | ICD-10-CM | POA: Diagnosis not present

## 2017-01-10 DIAGNOSIS — N183 Chronic kidney disease, stage 3 (moderate): Secondary | ICD-10-CM | POA: Diagnosis not present

## 2017-01-10 DIAGNOSIS — E876 Hypokalemia: Secondary | ICD-10-CM | POA: Diagnosis not present

## 2017-01-10 DIAGNOSIS — I129 Hypertensive chronic kidney disease with stage 1 through stage 4 chronic kidney disease, or unspecified chronic kidney disease: Secondary | ICD-10-CM | POA: Diagnosis not present

## 2017-01-10 DIAGNOSIS — I10 Essential (primary) hypertension: Secondary | ICD-10-CM | POA: Diagnosis not present

## 2017-01-17 ENCOUNTER — Other Ambulatory Visit (HOSPITAL_BASED_OUTPATIENT_CLINIC_OR_DEPARTMENT_OTHER): Payer: Self-pay | Admitting: Family Medicine

## 2017-01-17 DIAGNOSIS — Z1231 Encounter for screening mammogram for malignant neoplasm of breast: Secondary | ICD-10-CM

## 2017-01-28 ENCOUNTER — Ambulatory Visit (HOSPITAL_BASED_OUTPATIENT_CLINIC_OR_DEPARTMENT_OTHER)
Admission: RE | Admit: 2017-01-28 | Discharge: 2017-01-28 | Disposition: A | Discharge status: Home / Self Care | Payer: PPO | Source: Ambulatory Visit | Attending: Family Medicine | Admitting: Family Medicine

## 2017-01-28 DIAGNOSIS — Z1231 Encounter for screening mammogram for malignant neoplasm of breast: Secondary | ICD-10-CM | POA: Insufficient documentation

## 2017-02-21 DIAGNOSIS — G4733 Obstructive sleep apnea (adult) (pediatric): Secondary | ICD-10-CM | POA: Diagnosis not present

## 2017-02-22 DIAGNOSIS — I272 Pulmonary hypertension, unspecified: Secondary | ICD-10-CM | POA: Diagnosis not present

## 2017-02-22 DIAGNOSIS — G4733 Obstructive sleep apnea (adult) (pediatric): Secondary | ICD-10-CM | POA: Diagnosis not present

## 2017-02-22 DIAGNOSIS — I10 Essential (primary) hypertension: Secondary | ICD-10-CM | POA: Diagnosis not present

## 2017-03-30 DIAGNOSIS — E7800 Pure hypercholesterolemia, unspecified: Secondary | ICD-10-CM | POA: Diagnosis not present

## 2017-03-30 DIAGNOSIS — R5383 Other fatigue: Secondary | ICD-10-CM | POA: Diagnosis not present

## 2017-03-30 DIAGNOSIS — R635 Abnormal weight gain: Secondary | ICD-10-CM | POA: Diagnosis not present

## 2017-03-30 DIAGNOSIS — I509 Heart failure, unspecified: Secondary | ICD-10-CM | POA: Diagnosis not present

## 2017-03-30 DIAGNOSIS — R0602 Shortness of breath: Secondary | ICD-10-CM | POA: Diagnosis not present

## 2017-03-30 DIAGNOSIS — E559 Vitamin D deficiency, unspecified: Secondary | ICD-10-CM | POA: Diagnosis not present

## 2017-04-13 DIAGNOSIS — G4730 Sleep apnea, unspecified: Secondary | ICD-10-CM | POA: Diagnosis not present

## 2017-04-13 DIAGNOSIS — E041 Nontoxic single thyroid nodule: Secondary | ICD-10-CM | POA: Diagnosis not present

## 2017-04-13 DIAGNOSIS — E559 Vitamin D deficiency, unspecified: Secondary | ICD-10-CM | POA: Diagnosis not present

## 2017-04-13 DIAGNOSIS — R635 Abnormal weight gain: Secondary | ICD-10-CM | POA: Diagnosis not present

## 2017-05-13 DIAGNOSIS — E559 Vitamin D deficiency, unspecified: Secondary | ICD-10-CM | POA: Diagnosis not present

## 2017-05-13 DIAGNOSIS — G4730 Sleep apnea, unspecified: Secondary | ICD-10-CM | POA: Diagnosis not present

## 2017-05-13 DIAGNOSIS — E041 Nontoxic single thyroid nodule: Secondary | ICD-10-CM | POA: Diagnosis not present

## 2017-05-13 DIAGNOSIS — R635 Abnormal weight gain: Secondary | ICD-10-CM | POA: Diagnosis not present

## 2017-06-06 DIAGNOSIS — G4733 Obstructive sleep apnea (adult) (pediatric): Secondary | ICD-10-CM | POA: Diagnosis not present

## 2017-06-18 DIAGNOSIS — E559 Vitamin D deficiency, unspecified: Secondary | ICD-10-CM | POA: Diagnosis not present

## 2017-06-18 DIAGNOSIS — R635 Abnormal weight gain: Secondary | ICD-10-CM | POA: Diagnosis not present

## 2017-06-18 DIAGNOSIS — G4730 Sleep apnea, unspecified: Secondary | ICD-10-CM | POA: Diagnosis not present

## 2017-06-18 DIAGNOSIS — E041 Nontoxic single thyroid nodule: Secondary | ICD-10-CM | POA: Diagnosis not present

## 2017-07-29 DIAGNOSIS — E559 Vitamin D deficiency, unspecified: Secondary | ICD-10-CM | POA: Diagnosis not present

## 2017-07-29 DIAGNOSIS — R635 Abnormal weight gain: Secondary | ICD-10-CM | POA: Diagnosis not present

## 2017-07-29 DIAGNOSIS — G4730 Sleep apnea, unspecified: Secondary | ICD-10-CM | POA: Diagnosis not present

## 2017-07-29 DIAGNOSIS — E041 Nontoxic single thyroid nodule: Secondary | ICD-10-CM | POA: Diagnosis not present

## 2017-08-26 DIAGNOSIS — G4730 Sleep apnea, unspecified: Secondary | ICD-10-CM | POA: Diagnosis not present

## 2017-08-26 DIAGNOSIS — E559 Vitamin D deficiency, unspecified: Secondary | ICD-10-CM | POA: Diagnosis not present

## 2017-08-26 DIAGNOSIS — E041 Nontoxic single thyroid nodule: Secondary | ICD-10-CM | POA: Diagnosis not present

## 2017-09-18 DIAGNOSIS — G4733 Obstructive sleep apnea (adult) (pediatric): Secondary | ICD-10-CM | POA: Diagnosis not present

## 2017-09-23 DIAGNOSIS — I10 Essential (primary) hypertension: Secondary | ICD-10-CM | POA: Diagnosis not present

## 2017-09-23 DIAGNOSIS — E041 Nontoxic single thyroid nodule: Secondary | ICD-10-CM | POA: Diagnosis not present

## 2017-09-23 DIAGNOSIS — E559 Vitamin D deficiency, unspecified: Secondary | ICD-10-CM | POA: Diagnosis not present

## 2017-09-23 DIAGNOSIS — G4730 Sleep apnea, unspecified: Secondary | ICD-10-CM | POA: Diagnosis not present

## 2017-10-21 DIAGNOSIS — G4730 Sleep apnea, unspecified: Secondary | ICD-10-CM | POA: Diagnosis not present

## 2017-10-21 DIAGNOSIS — I10 Essential (primary) hypertension: Secondary | ICD-10-CM | POA: Diagnosis not present

## 2017-10-21 DIAGNOSIS — E559 Vitamin D deficiency, unspecified: Secondary | ICD-10-CM | POA: Diagnosis not present

## 2017-10-21 DIAGNOSIS — R0602 Shortness of breath: Secondary | ICD-10-CM | POA: Diagnosis not present

## 2017-10-21 DIAGNOSIS — E538 Deficiency of other specified B group vitamins: Secondary | ICD-10-CM | POA: Diagnosis not present

## 2017-10-21 DIAGNOSIS — I509 Heart failure, unspecified: Secondary | ICD-10-CM | POA: Diagnosis not present

## 2017-11-04 DIAGNOSIS — E538 Deficiency of other specified B group vitamins: Secondary | ICD-10-CM | POA: Diagnosis not present

## 2017-11-18 DIAGNOSIS — R9431 Abnormal electrocardiogram [ECG] [EKG]: Secondary | ICD-10-CM | POA: Diagnosis not present

## 2017-11-18 DIAGNOSIS — I10 Essential (primary) hypertension: Secondary | ICD-10-CM | POA: Diagnosis not present

## 2017-11-18 DIAGNOSIS — I509 Heart failure, unspecified: Secondary | ICD-10-CM | POA: Diagnosis not present

## 2017-11-18 DIAGNOSIS — Z8701 Personal history of pneumonia (recurrent): Secondary | ICD-10-CM | POA: Diagnosis not present

## 2017-11-18 DIAGNOSIS — E538 Deficiency of other specified B group vitamins: Secondary | ICD-10-CM | POA: Diagnosis not present

## 2017-12-25 DIAGNOSIS — R1084 Generalized abdominal pain: Secondary | ICD-10-CM | POA: Diagnosis not present

## 2017-12-25 DIAGNOSIS — R310 Gross hematuria: Secondary | ICD-10-CM | POA: Diagnosis not present

## 2018-01-01 DIAGNOSIS — R9431 Abnormal electrocardiogram [ECG] [EKG]: Secondary | ICD-10-CM | POA: Diagnosis not present

## 2018-01-01 DIAGNOSIS — R0602 Shortness of breath: Secondary | ICD-10-CM | POA: Diagnosis not present

## 2018-01-01 DIAGNOSIS — Z780 Asymptomatic menopausal state: Secondary | ICD-10-CM | POA: Diagnosis not present

## 2018-01-01 DIAGNOSIS — E041 Nontoxic single thyroid nodule: Secondary | ICD-10-CM | POA: Diagnosis not present

## 2018-01-25 DIAGNOSIS — G4733 Obstructive sleep apnea (adult) (pediatric): Secondary | ICD-10-CM | POA: Diagnosis not present

## 2018-02-10 DIAGNOSIS — E041 Nontoxic single thyroid nodule: Secondary | ICD-10-CM | POA: Diagnosis not present

## 2018-02-10 DIAGNOSIS — I509 Heart failure, unspecified: Secondary | ICD-10-CM | POA: Diagnosis not present

## 2018-02-10 DIAGNOSIS — R635 Abnormal weight gain: Secondary | ICD-10-CM | POA: Diagnosis not present

## 2018-05-05 DIAGNOSIS — I10 Essential (primary) hypertension: Secondary | ICD-10-CM | POA: Diagnosis not present

## 2018-05-05 DIAGNOSIS — E559 Vitamin D deficiency, unspecified: Secondary | ICD-10-CM | POA: Diagnosis not present

## 2018-05-05 DIAGNOSIS — E7800 Pure hypercholesterolemia, unspecified: Secondary | ICD-10-CM | POA: Diagnosis not present

## 2018-05-05 DIAGNOSIS — E538 Deficiency of other specified B group vitamins: Secondary | ICD-10-CM | POA: Diagnosis not present

## 2018-05-06 IMAGING — MG 2D DIGITAL SCREENING BILATERAL MAMMOGRAM WITH CAD AND ADJUNCT TO
8 of 11 series · 8 of 27 positions shown · non-contrast
Comparison: None.

CLINICAL DATA: Screening.

EXAM:
2D DIGITAL SCREENING BILATERAL MAMMOGRAM WITH CAD AND ADJUNCT TOMO

[L MLO (1 of 2)]
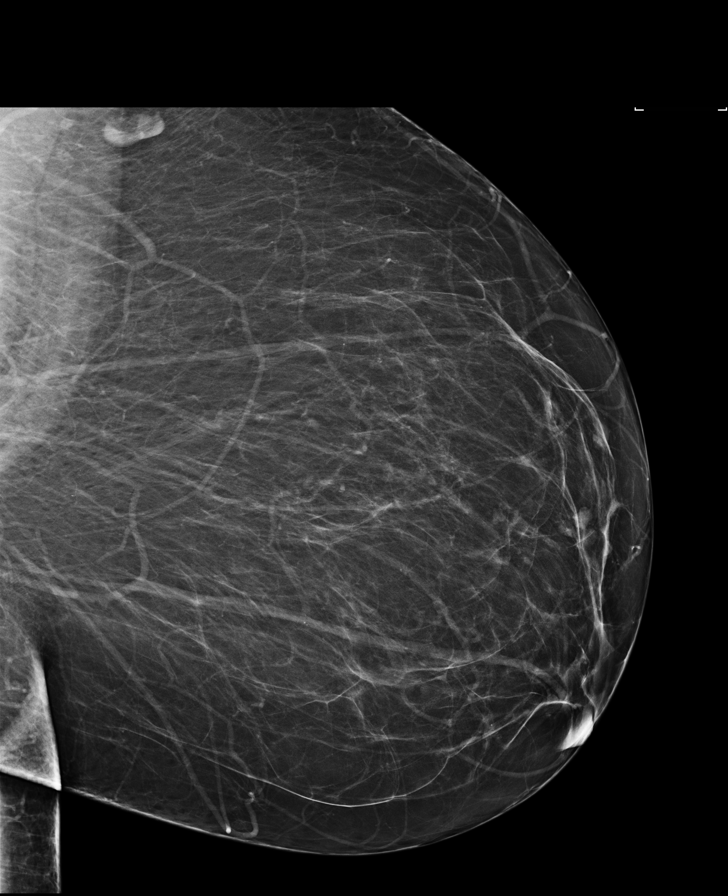

[R CC (1 of 2)]
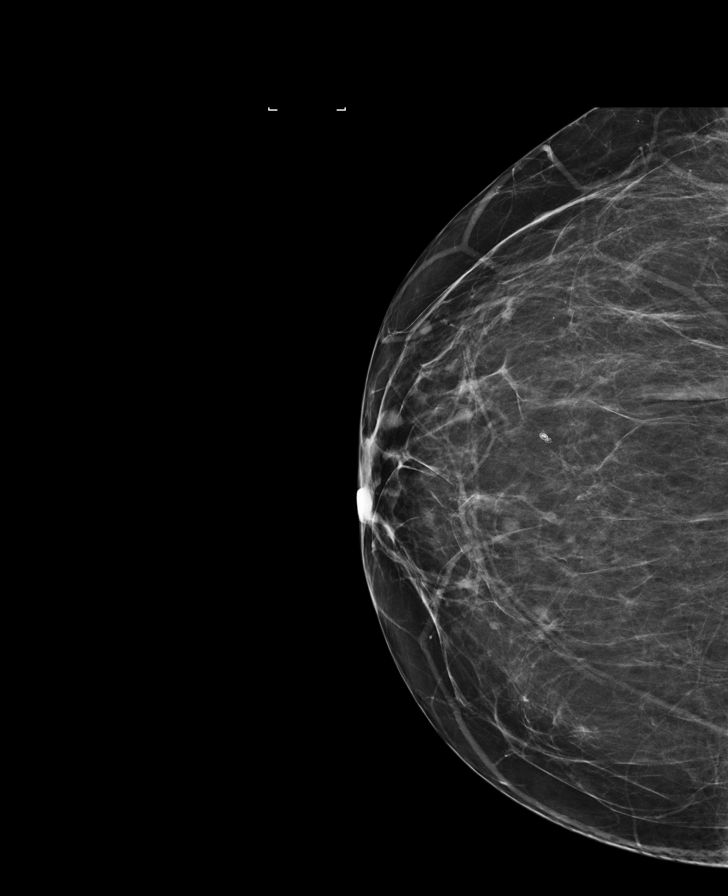

[L CC (1 of 2)]
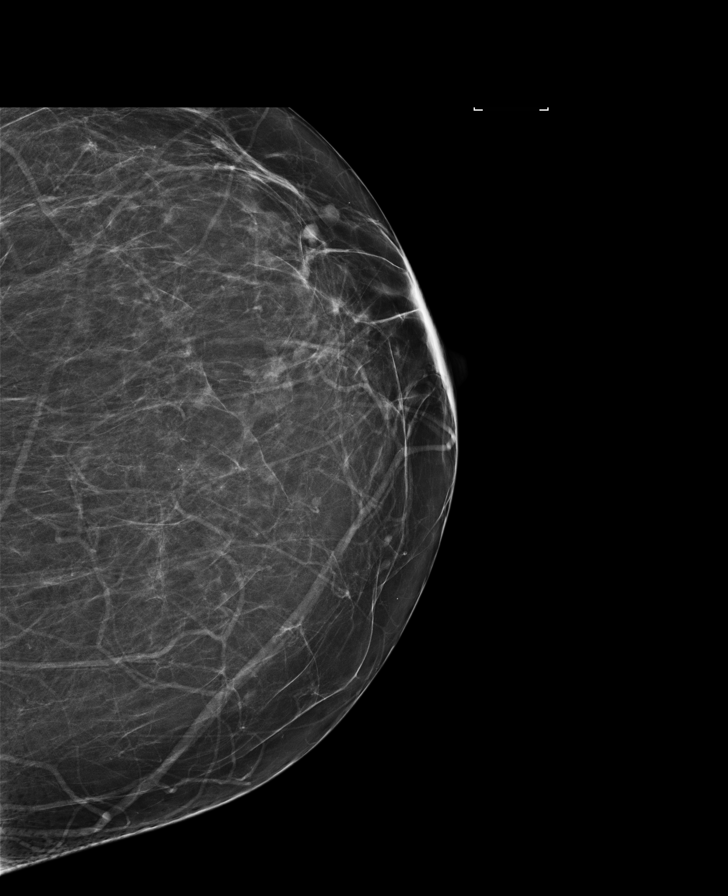

[R MLO]
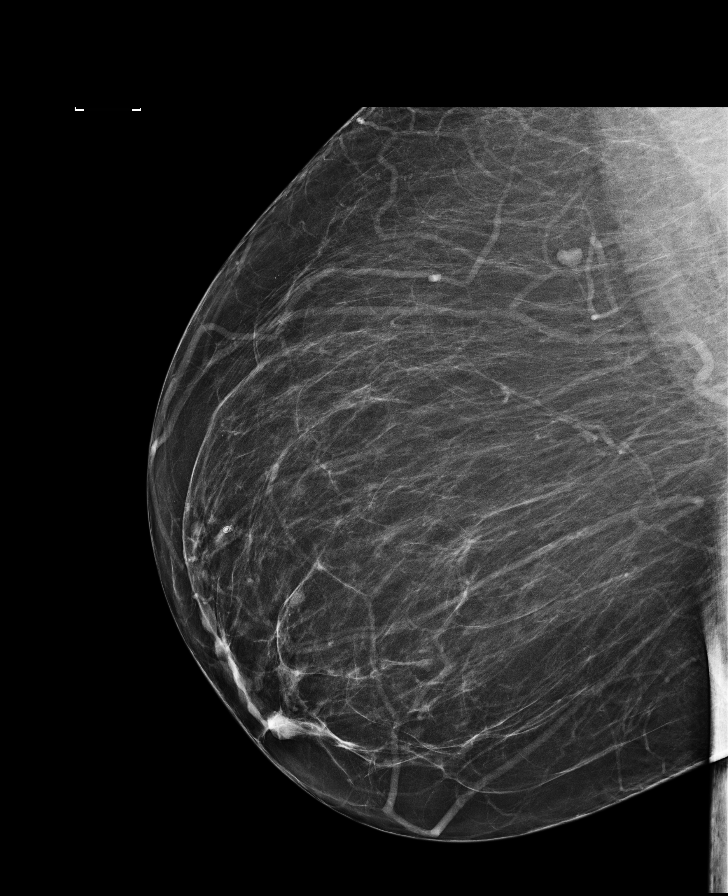

[L CC (2 of 2)]
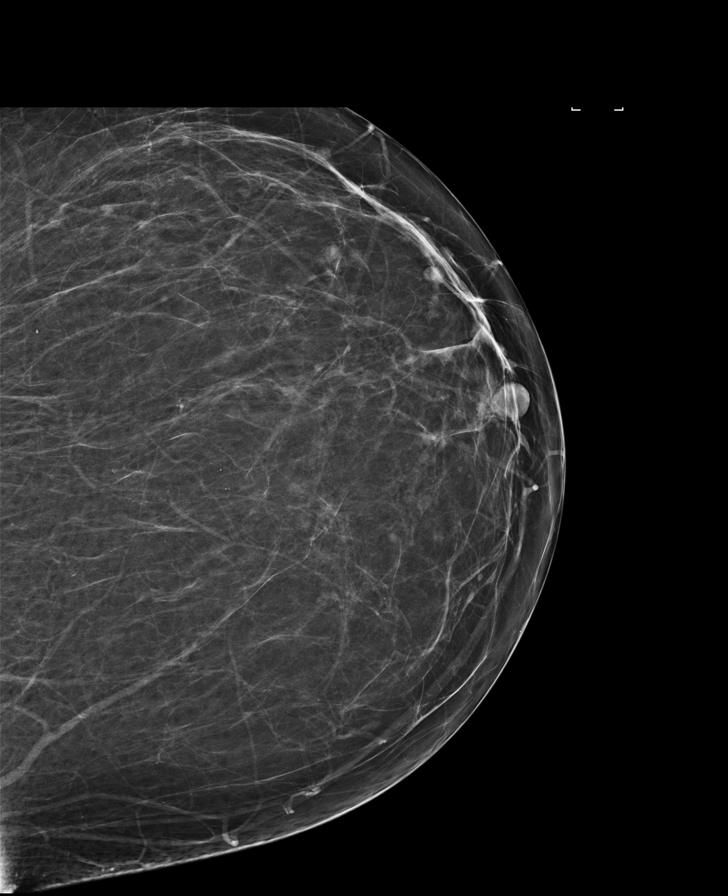

[L MLO (2 of 2)]
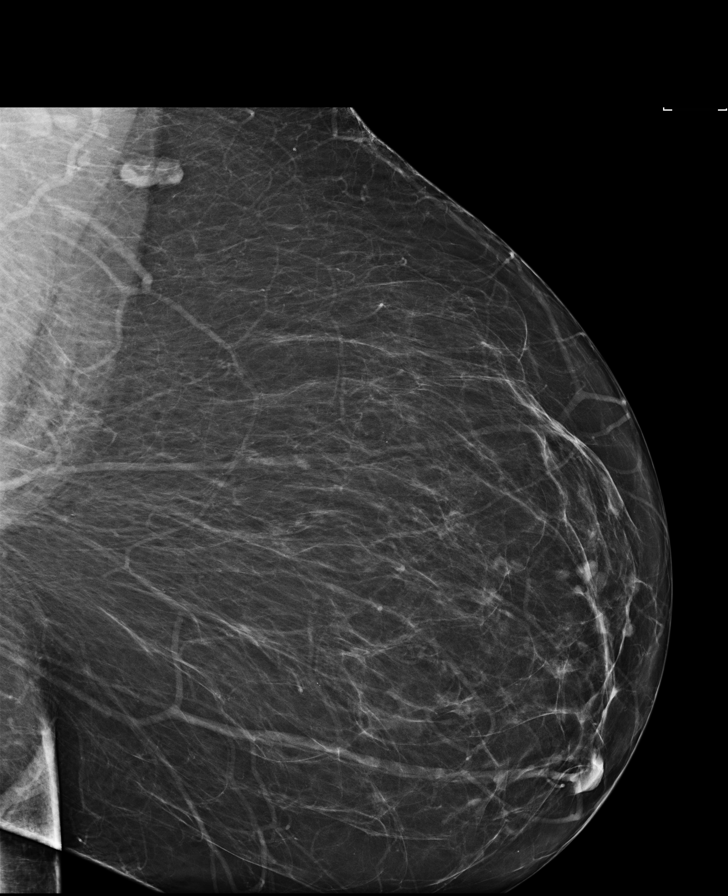

[R CC (2 of 2)]
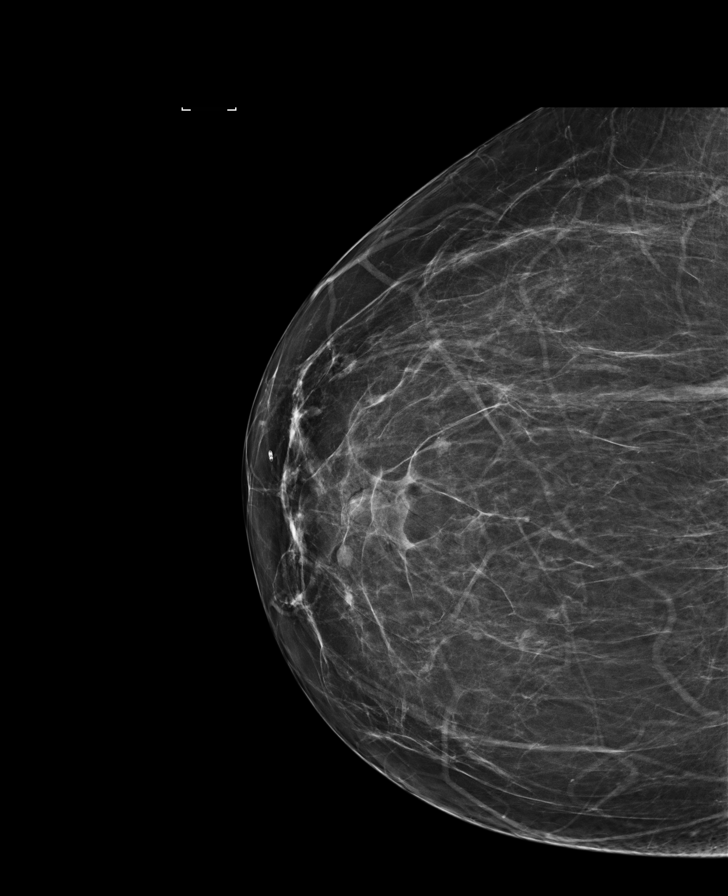

[L MLO tomo · tomo slice 45/89.0]
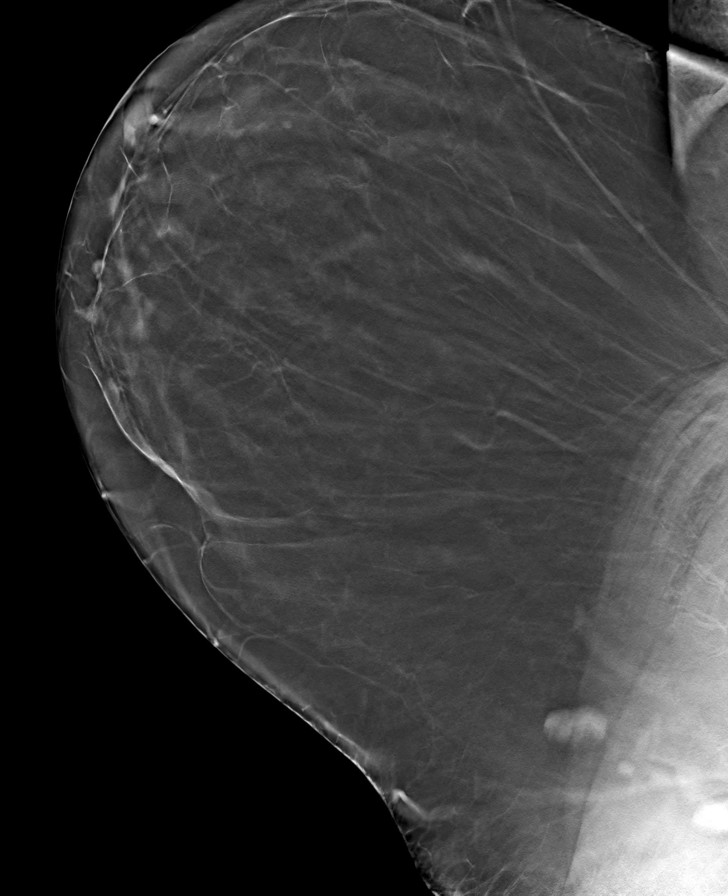

[8 of 27 positions shown; findings below may reference images not displayed]

ACR Breast Density Category b: There are scattered areas of
fibroglandular density.
FINDINGS: There are no findings suspicious for malignancy. Images were
processed with CAD.
IMPRESSION: No mammographic evidence of malignancy. A result letter of this
screening mammogram will be mailed directly to the patient.

RECOMMENDATION:
Screening mammogram in one year. (Code:[id number])

BI-RADS CATEGORY  1: Negative.

## 2018-05-17 DIAGNOSIS — G4733 Obstructive sleep apnea (adult) (pediatric): Secondary | ICD-10-CM | POA: Diagnosis not present

## 2018-05-19 DIAGNOSIS — I10 Essential (primary) hypertension: Secondary | ICD-10-CM | POA: Diagnosis not present

## 2018-05-19 DIAGNOSIS — Z0000 Encounter for general adult medical examination without abnormal findings: Secondary | ICD-10-CM | POA: Diagnosis not present

## 2018-05-19 DIAGNOSIS — E7800 Pure hypercholesterolemia, unspecified: Secondary | ICD-10-CM | POA: Diagnosis not present

## 2018-05-19 DIAGNOSIS — Z6841 Body Mass Index (BMI) 40.0 and over, adult: Secondary | ICD-10-CM | POA: Diagnosis not present

## 2018-05-19 DIAGNOSIS — Z23 Encounter for immunization: Secondary | ICD-10-CM | POA: Diagnosis not present

## 2018-05-19 DIAGNOSIS — R635 Abnormal weight gain: Secondary | ICD-10-CM | POA: Diagnosis not present

## 2018-06-16 DIAGNOSIS — I10 Essential (primary) hypertension: Secondary | ICD-10-CM | POA: Diagnosis not present

## 2018-06-16 DIAGNOSIS — R635 Abnormal weight gain: Secondary | ICD-10-CM | POA: Diagnosis not present

## 2018-06-16 DIAGNOSIS — E042 Nontoxic multinodular goiter: Secondary | ICD-10-CM | POA: Diagnosis not present

## 2018-06-16 DIAGNOSIS — E7800 Pure hypercholesterolemia, unspecified: Secondary | ICD-10-CM | POA: Diagnosis not present

## 2018-07-07 DIAGNOSIS — I519 Heart disease, unspecified: Secondary | ICD-10-CM | POA: Diagnosis not present

## 2018-07-07 DIAGNOSIS — I10 Essential (primary) hypertension: Secondary | ICD-10-CM | POA: Diagnosis not present

## 2018-07-07 DIAGNOSIS — Z1231 Encounter for screening mammogram for malignant neoplasm of breast: Secondary | ICD-10-CM | POA: Diagnosis not present

## 2018-07-07 DIAGNOSIS — J209 Acute bronchitis, unspecified: Secondary | ICD-10-CM | POA: Diagnosis not present

## 2018-07-07 DIAGNOSIS — Z6841 Body Mass Index (BMI) 40.0 and over, adult: Secondary | ICD-10-CM | POA: Diagnosis not present

## 2018-07-07 DIAGNOSIS — I2510 Atherosclerotic heart disease of native coronary artery without angina pectoris: Secondary | ICD-10-CM | POA: Diagnosis not present

## 2018-07-07 DIAGNOSIS — R509 Fever, unspecified: Secondary | ICD-10-CM | POA: Diagnosis not present

## 2018-07-07 DIAGNOSIS — M5136 Other intervertebral disc degeneration, lumbar region: Secondary | ICD-10-CM | POA: Diagnosis not present

## 2018-07-07 DIAGNOSIS — E7800 Pure hypercholesterolemia, unspecified: Secondary | ICD-10-CM | POA: Diagnosis not present

## 2018-07-07 DIAGNOSIS — E785 Hyperlipidemia, unspecified: Secondary | ICD-10-CM | POA: Diagnosis not present

## 2018-07-07 DIAGNOSIS — J439 Emphysema, unspecified: Secondary | ICD-10-CM | POA: Diagnosis not present

## 2018-07-30 DIAGNOSIS — E042 Nontoxic multinodular goiter: Secondary | ICD-10-CM | POA: Diagnosis not present

## 2018-07-31 DIAGNOSIS — R9431 Abnormal electrocardiogram [ECG] [EKG]: Secondary | ICD-10-CM | POA: Diagnosis not present

## 2018-09-28 DIAGNOSIS — G4733 Obstructive sleep apnea (adult) (pediatric): Secondary | ICD-10-CM | POA: Diagnosis not present

## 2018-10-22 DIAGNOSIS — E559 Vitamin D deficiency, unspecified: Secondary | ICD-10-CM | POA: Diagnosis not present

## 2018-10-22 DIAGNOSIS — M170 Bilateral primary osteoarthritis of knee: Secondary | ICD-10-CM | POA: Diagnosis not present

## 2018-10-22 DIAGNOSIS — I10 Essential (primary) hypertension: Secondary | ICD-10-CM | POA: Diagnosis not present

## 2018-10-22 DIAGNOSIS — E7800 Pure hypercholesterolemia, unspecified: Secondary | ICD-10-CM | POA: Diagnosis not present

## 2018-10-22 DIAGNOSIS — Z1159 Encounter for screening for other viral diseases: Secondary | ICD-10-CM | POA: Diagnosis not present

## 2018-10-22 DIAGNOSIS — Z79899 Other long term (current) drug therapy: Secondary | ICD-10-CM | POA: Diagnosis not present

## 2018-10-22 DIAGNOSIS — E042 Nontoxic multinodular goiter: Secondary | ICD-10-CM | POA: Diagnosis not present

## 2018-11-06 DIAGNOSIS — Z79899 Other long term (current) drug therapy: Secondary | ICD-10-CM | POA: Diagnosis not present

## 2018-11-06 DIAGNOSIS — I10 Essential (primary) hypertension: Secondary | ICD-10-CM | POA: Diagnosis not present

## 2018-11-06 DIAGNOSIS — M170 Bilateral primary osteoarthritis of knee: Secondary | ICD-10-CM | POA: Diagnosis not present

## 2018-11-06 DIAGNOSIS — E7800 Pure hypercholesterolemia, unspecified: Secondary | ICD-10-CM | POA: Diagnosis not present

## 2018-11-06 DIAGNOSIS — Z6841 Body Mass Index (BMI) 40.0 and over, adult: Secondary | ICD-10-CM | POA: Diagnosis not present

## 2018-11-27 DIAGNOSIS — M170 Bilateral primary osteoarthritis of knee: Secondary | ICD-10-CM | POA: Diagnosis not present

## 2018-11-27 DIAGNOSIS — M25561 Pain in right knee: Secondary | ICD-10-CM | POA: Diagnosis not present

## 2018-11-27 DIAGNOSIS — N183 Chronic kidney disease, stage 3 (moderate): Secondary | ICD-10-CM | POA: Diagnosis not present

## 2018-11-27 DIAGNOSIS — I10 Essential (primary) hypertension: Secondary | ICD-10-CM | POA: Diagnosis not present

## 2018-11-27 DIAGNOSIS — M25562 Pain in left knee: Secondary | ICD-10-CM | POA: Diagnosis not present

## 2018-12-24 DIAGNOSIS — Z23 Encounter for immunization: Secondary | ICD-10-CM | POA: Diagnosis not present

## 2018-12-24 DIAGNOSIS — I10 Essential (primary) hypertension: Secondary | ICD-10-CM | POA: Diagnosis not present

## 2018-12-24 DIAGNOSIS — M170 Bilateral primary osteoarthritis of knee: Secondary | ICD-10-CM | POA: Diagnosis not present

## 2018-12-24 DIAGNOSIS — E7800 Pure hypercholesterolemia, unspecified: Secondary | ICD-10-CM | POA: Diagnosis not present

## 2018-12-24 DIAGNOSIS — R399 Unspecified symptoms and signs involving the genitourinary system: Secondary | ICD-10-CM | POA: Diagnosis not present

## 2019-01-31 DIAGNOSIS — E0590 Thyrotoxicosis, unspecified without thyrotoxic crisis or storm: Secondary | ICD-10-CM | POA: Diagnosis not present

## 2019-01-31 DIAGNOSIS — E042 Nontoxic multinodular goiter: Secondary | ICD-10-CM | POA: Diagnosis not present

## 2019-01-31 DIAGNOSIS — R3129 Other microscopic hematuria: Secondary | ICD-10-CM | POA: Diagnosis not present

## 2019-02-07 DIAGNOSIS — G4733 Obstructive sleep apnea (adult) (pediatric): Secondary | ICD-10-CM | POA: Diagnosis not present

## 2019-02-10 DIAGNOSIS — R300 Dysuria: Secondary | ICD-10-CM | POA: Diagnosis not present

## 2019-02-10 DIAGNOSIS — Z1159 Encounter for screening for other viral diseases: Secondary | ICD-10-CM | POA: Diagnosis not present

## 2019-02-10 DIAGNOSIS — R350 Frequency of micturition: Secondary | ICD-10-CM | POA: Diagnosis not present

## 2019-02-10 DIAGNOSIS — R310 Gross hematuria: Secondary | ICD-10-CM | POA: Diagnosis not present

## 2019-02-10 DIAGNOSIS — R3989 Other symptoms and signs involving the genitourinary system: Secondary | ICD-10-CM | POA: Diagnosis not present

## 2019-02-10 DIAGNOSIS — N200 Calculus of kidney: Secondary | ICD-10-CM | POA: Diagnosis not present

## 2019-02-10 DIAGNOSIS — Z79899 Other long term (current) drug therapy: Secondary | ICD-10-CM | POA: Diagnosis not present

## 2019-02-12 DIAGNOSIS — E559 Vitamin D deficiency, unspecified: Secondary | ICD-10-CM | POA: Diagnosis not present

## 2019-02-12 DIAGNOSIS — I509 Heart failure, unspecified: Secondary | ICD-10-CM | POA: Diagnosis not present

## 2019-02-12 DIAGNOSIS — I10 Essential (primary) hypertension: Secondary | ICD-10-CM | POA: Diagnosis not present

## 2019-02-12 DIAGNOSIS — E7800 Pure hypercholesterolemia, unspecified: Secondary | ICD-10-CM | POA: Diagnosis not present

## 2019-02-12 DIAGNOSIS — Z79899 Other long term (current) drug therapy: Secondary | ICD-10-CM | POA: Diagnosis not present

## 2019-02-12 DIAGNOSIS — Z1331 Encounter for screening for depression: Secondary | ICD-10-CM | POA: Diagnosis not present

## 2019-02-12 DIAGNOSIS — Z1389 Encounter for screening for other disorder: Secondary | ICD-10-CM | POA: Diagnosis not present

## 2019-02-12 DIAGNOSIS — Z6841 Body Mass Index (BMI) 40.0 and over, adult: Secondary | ICD-10-CM | POA: Diagnosis not present

## 2019-02-12 DIAGNOSIS — Z1159 Encounter for screening for other viral diseases: Secondary | ICD-10-CM | POA: Diagnosis not present

## 2019-02-12 DIAGNOSIS — R0602 Shortness of breath: Secondary | ICD-10-CM | POA: Diagnosis not present

## 2019-05-28 DIAGNOSIS — G4733 Obstructive sleep apnea (adult) (pediatric): Secondary | ICD-10-CM | POA: Diagnosis not present

## 2019-06-04 DIAGNOSIS — E0590 Thyrotoxicosis, unspecified without thyrotoxic crisis or storm: Secondary | ICD-10-CM | POA: Diagnosis not present

## 2019-06-04 DIAGNOSIS — E042 Nontoxic multinodular goiter: Secondary | ICD-10-CM | POA: Diagnosis not present

## 2019-07-04 DIAGNOSIS — R1310 Dysphagia, unspecified: Secondary | ICD-10-CM | POA: Diagnosis not present

## 2019-07-04 DIAGNOSIS — R0600 Dyspnea, unspecified: Secondary | ICD-10-CM | POA: Diagnosis not present

## 2019-07-04 DIAGNOSIS — E042 Nontoxic multinodular goiter: Secondary | ICD-10-CM | POA: Diagnosis not present

## 2019-07-31 DIAGNOSIS — Z20822 Contact with and (suspected) exposure to covid-19: Secondary | ICD-10-CM | POA: Diagnosis not present

## 2019-07-31 DIAGNOSIS — Z01812 Encounter for preprocedural laboratory examination: Secondary | ICD-10-CM | POA: Diagnosis not present

## 2019-07-31 DIAGNOSIS — E042 Nontoxic multinodular goiter: Secondary | ICD-10-CM | POA: Diagnosis not present

## 2019-08-01 DIAGNOSIS — Z01818 Encounter for other preprocedural examination: Secondary | ICD-10-CM | POA: Diagnosis not present

## 2019-08-01 DIAGNOSIS — E042 Nontoxic multinodular goiter: Secondary | ICD-10-CM | POA: Diagnosis not present

## 2019-08-01 DIAGNOSIS — Z01812 Encounter for preprocedural laboratory examination: Secondary | ICD-10-CM | POA: Diagnosis not present

## 2019-08-01 DIAGNOSIS — I5032 Chronic diastolic (congestive) heart failure: Secondary | ICD-10-CM | POA: Diagnosis not present

## 2019-08-01 DIAGNOSIS — Z01810 Encounter for preprocedural cardiovascular examination: Secondary | ICD-10-CM | POA: Diagnosis not present

## 2019-08-07 DIAGNOSIS — Z6841 Body Mass Index (BMI) 40.0 and over, adult: Secondary | ICD-10-CM | POA: Diagnosis not present

## 2019-08-07 DIAGNOSIS — I110 Hypertensive heart disease with heart failure: Secondary | ICD-10-CM | POA: Diagnosis not present

## 2019-08-07 DIAGNOSIS — E039 Hypothyroidism, unspecified: Secondary | ICD-10-CM | POA: Diagnosis not present

## 2019-08-07 DIAGNOSIS — Z9989 Dependence on other enabling machines and devices: Secondary | ICD-10-CM | POA: Diagnosis not present

## 2019-08-07 DIAGNOSIS — G4733 Obstructive sleep apnea (adult) (pediatric): Secondary | ICD-10-CM | POA: Diagnosis not present

## 2019-08-07 DIAGNOSIS — Z20822 Contact with and (suspected) exposure to covid-19: Secondary | ICD-10-CM | POA: Diagnosis not present

## 2019-08-07 DIAGNOSIS — Z79899 Other long term (current) drug therapy: Secondary | ICD-10-CM | POA: Diagnosis not present

## 2019-08-07 DIAGNOSIS — R079 Chest pain, unspecified: Secondary | ICD-10-CM | POA: Diagnosis not present

## 2019-08-07 DIAGNOSIS — E048 Other specified nontoxic goiter: Secondary | ICD-10-CM | POA: Diagnosis not present

## 2019-08-07 DIAGNOSIS — E042 Nontoxic multinodular goiter: Secondary | ICD-10-CM | POA: Diagnosis not present

## 2019-08-07 DIAGNOSIS — I5030 Unspecified diastolic (congestive) heart failure: Secondary | ICD-10-CM | POA: Diagnosis not present

## 2019-08-07 DIAGNOSIS — I272 Pulmonary hypertension, unspecified: Secondary | ICD-10-CM | POA: Diagnosis not present

## 2019-08-07 DIAGNOSIS — R0602 Shortness of breath: Secondary | ICD-10-CM | POA: Diagnosis not present

## 2019-08-07 DIAGNOSIS — I129 Hypertensive chronic kidney disease with stage 1 through stage 4 chronic kidney disease, or unspecified chronic kidney disease: Secondary | ICD-10-CM | POA: Diagnosis not present

## 2019-08-07 DIAGNOSIS — E785 Hyperlipidemia, unspecified: Secondary | ICD-10-CM | POA: Diagnosis not present

## 2019-08-07 DIAGNOSIS — N183 Chronic kidney disease, stage 3 unspecified: Secondary | ICD-10-CM | POA: Diagnosis not present

## 2019-08-08 DIAGNOSIS — R0789 Other chest pain: Secondary | ICD-10-CM | POA: Diagnosis not present

## 2019-08-08 DIAGNOSIS — I5033 Acute on chronic diastolic (congestive) heart failure: Secondary | ICD-10-CM | POA: Diagnosis not present

## 2019-08-08 DIAGNOSIS — R9431 Abnormal electrocardiogram [ECG] [EKG]: Secondary | ICD-10-CM | POA: Diagnosis not present

## 2019-08-08 DIAGNOSIS — E042 Nontoxic multinodular goiter: Secondary | ICD-10-CM | POA: Diagnosis not present

## 2019-08-08 DIAGNOSIS — E890 Postprocedural hypothyroidism: Secondary | ICD-10-CM | POA: Diagnosis not present

## 2019-08-08 DIAGNOSIS — R05 Cough: Secondary | ICD-10-CM | POA: Diagnosis not present

## 2019-08-08 DIAGNOSIS — I110 Hypertensive heart disease with heart failure: Secondary | ICD-10-CM | POA: Diagnosis not present

## 2019-08-08 DIAGNOSIS — I5032 Chronic diastolic (congestive) heart failure: Secondary | ICD-10-CM | POA: Diagnosis not present

## 2019-08-08 DIAGNOSIS — J9811 Atelectasis: Secondary | ICD-10-CM | POA: Diagnosis not present

## 2019-08-09 DIAGNOSIS — Z9889 Other specified postprocedural states: Secondary | ICD-10-CM | POA: Diagnosis not present

## 2019-08-09 DIAGNOSIS — R918 Other nonspecific abnormal finding of lung field: Secondary | ICD-10-CM | POA: Diagnosis not present

## 2019-08-09 DIAGNOSIS — R05 Cough: Secondary | ICD-10-CM | POA: Diagnosis not present

## 2019-08-09 DIAGNOSIS — R0602 Shortness of breath: Secondary | ICD-10-CM | POA: Diagnosis not present

## 2019-08-09 DIAGNOSIS — R079 Chest pain, unspecified: Secondary | ICD-10-CM | POA: Diagnosis not present

## 2019-08-22 DIAGNOSIS — E890 Postprocedural hypothyroidism: Secondary | ICD-10-CM | POA: Diagnosis not present

## 2019-08-22 DIAGNOSIS — E042 Nontoxic multinodular goiter: Secondary | ICD-10-CM | POA: Diagnosis not present

## 2019-08-27 DIAGNOSIS — G4733 Obstructive sleep apnea (adult) (pediatric): Secondary | ICD-10-CM | POA: Diagnosis not present

## 2019-09-04 DIAGNOSIS — E890 Postprocedural hypothyroidism: Secondary | ICD-10-CM | POA: Diagnosis not present

## 2019-09-22 DIAGNOSIS — M170 Bilateral primary osteoarthritis of knee: Secondary | ICD-10-CM | POA: Diagnosis not present

## 2019-09-22 DIAGNOSIS — Z0000 Encounter for general adult medical examination without abnormal findings: Secondary | ICD-10-CM | POA: Diagnosis not present

## 2019-09-22 DIAGNOSIS — Z20822 Contact with and (suspected) exposure to covid-19: Secondary | ICD-10-CM | POA: Diagnosis not present

## 2019-09-22 DIAGNOSIS — Z79899 Other long term (current) drug therapy: Secondary | ICD-10-CM | POA: Diagnosis not present

## 2019-09-22 DIAGNOSIS — E7800 Pure hypercholesterolemia, unspecified: Secondary | ICD-10-CM | POA: Diagnosis not present

## 2019-09-22 DIAGNOSIS — I10 Essential (primary) hypertension: Secondary | ICD-10-CM | POA: Diagnosis not present

## 2019-09-22 DIAGNOSIS — E559 Vitamin D deficiency, unspecified: Secondary | ICD-10-CM | POA: Diagnosis not present

## 2019-09-22 DIAGNOSIS — I509 Heart failure, unspecified: Secondary | ICD-10-CM | POA: Diagnosis not present

## 2020-02-04 DIAGNOSIS — G4733 Obstructive sleep apnea (adult) (pediatric): Secondary | ICD-10-CM | POA: Diagnosis not present

## 2020-02-08 DIAGNOSIS — I10 Essential (primary) hypertension: Secondary | ICD-10-CM | POA: Diagnosis not present

## 2020-02-08 DIAGNOSIS — Z23 Encounter for immunization: Secondary | ICD-10-CM | POA: Diagnosis not present

## 2020-02-08 DIAGNOSIS — E042 Nontoxic multinodular goiter: Secondary | ICD-10-CM | POA: Diagnosis not present

## 2020-02-08 DIAGNOSIS — I509 Heart failure, unspecified: Secondary | ICD-10-CM | POA: Diagnosis not present

## 2020-02-08 DIAGNOSIS — M170 Bilateral primary osteoarthritis of knee: Secondary | ICD-10-CM | POA: Diagnosis not present

## 2020-02-08 DIAGNOSIS — Z6841 Body Mass Index (BMI) 40.0 and over, adult: Secondary | ICD-10-CM | POA: Diagnosis not present

## 2020-02-08 DIAGNOSIS — Z79899 Other long term (current) drug therapy: Secondary | ICD-10-CM | POA: Diagnosis not present

## 2020-02-08 DIAGNOSIS — E7800 Pure hypercholesterolemia, unspecified: Secondary | ICD-10-CM | POA: Diagnosis not present

## 2020-02-08 DIAGNOSIS — E559 Vitamin D deficiency, unspecified: Secondary | ICD-10-CM | POA: Diagnosis not present

## 2020-02-08 DIAGNOSIS — Z20822 Contact with and (suspected) exposure to covid-19: Secondary | ICD-10-CM | POA: Diagnosis not present

## 2020-03-04 DIAGNOSIS — E890 Postprocedural hypothyroidism: Secondary | ICD-10-CM | POA: Diagnosis not present

## 2020-04-02 DIAGNOSIS — Z1231 Encounter for screening mammogram for malignant neoplasm of breast: Secondary | ICD-10-CM | POA: Diagnosis not present

## 2020-05-25 DIAGNOSIS — G4733 Obstructive sleep apnea (adult) (pediatric): Secondary | ICD-10-CM | POA: Diagnosis not present

## 2020-06-25 DIAGNOSIS — R635 Abnormal weight gain: Secondary | ICD-10-CM | POA: Diagnosis not present

## 2020-06-25 DIAGNOSIS — Z6841 Body Mass Index (BMI) 40.0 and over, adult: Secondary | ICD-10-CM | POA: Diagnosis not present

## 2020-06-25 DIAGNOSIS — I509 Heart failure, unspecified: Secondary | ICD-10-CM | POA: Diagnosis not present

## 2020-06-27 DIAGNOSIS — Z79899 Other long term (current) drug therapy: Secondary | ICD-10-CM | POA: Diagnosis not present

## 2020-06-27 DIAGNOSIS — R0602 Shortness of breath: Secondary | ICD-10-CM | POA: Diagnosis not present

## 2020-06-27 DIAGNOSIS — Z1331 Encounter for screening for depression: Secondary | ICD-10-CM | POA: Diagnosis not present

## 2020-06-27 DIAGNOSIS — Z6841 Body Mass Index (BMI) 40.0 and over, adult: Secondary | ICD-10-CM | POA: Diagnosis not present

## 2020-06-27 DIAGNOSIS — Z1159 Encounter for screening for other viral diseases: Secondary | ICD-10-CM | POA: Diagnosis not present

## 2020-06-27 DIAGNOSIS — I509 Heart failure, unspecified: Secondary | ICD-10-CM | POA: Diagnosis not present

## 2020-06-27 DIAGNOSIS — I10 Essential (primary) hypertension: Secondary | ICD-10-CM | POA: Diagnosis not present

## 2020-06-27 DIAGNOSIS — E042 Nontoxic multinodular goiter: Secondary | ICD-10-CM | POA: Diagnosis not present

## 2020-06-27 DIAGNOSIS — Z1339 Encounter for screening examination for other mental health and behavioral disorders: Secondary | ICD-10-CM | POA: Diagnosis not present

## 2020-06-27 DIAGNOSIS — E559 Vitamin D deficiency, unspecified: Secondary | ICD-10-CM | POA: Diagnosis not present

## 2020-06-27 DIAGNOSIS — E7800 Pure hypercholesterolemia, unspecified: Secondary | ICD-10-CM | POA: Diagnosis not present

## 2020-07-27 DIAGNOSIS — R635 Abnormal weight gain: Secondary | ICD-10-CM | POA: Diagnosis not present

## 2020-07-27 DIAGNOSIS — Z6841 Body Mass Index (BMI) 40.0 and over, adult: Secondary | ICD-10-CM | POA: Diagnosis not present

## 2020-07-28 DIAGNOSIS — I509 Heart failure, unspecified: Secondary | ICD-10-CM | POA: Diagnosis not present

## 2020-07-28 DIAGNOSIS — I10 Essential (primary) hypertension: Secondary | ICD-10-CM | POA: Diagnosis not present

## 2020-07-28 DIAGNOSIS — R9431 Abnormal electrocardiogram [ECG] [EKG]: Secondary | ICD-10-CM | POA: Diagnosis not present

## 2020-07-28 DIAGNOSIS — G4730 Sleep apnea, unspecified: Secondary | ICD-10-CM | POA: Diagnosis not present

## 2020-07-28 DIAGNOSIS — R0602 Shortness of breath: Secondary | ICD-10-CM | POA: Diagnosis not present

## 2020-07-28 DIAGNOSIS — I517 Cardiomegaly: Secondary | ICD-10-CM | POA: Diagnosis not present

## 2020-08-19 DIAGNOSIS — E538 Deficiency of other specified B group vitamins: Secondary | ICD-10-CM | POA: Diagnosis not present

## 2020-08-19 DIAGNOSIS — R635 Abnormal weight gain: Secondary | ICD-10-CM | POA: Diagnosis not present

## 2020-08-19 DIAGNOSIS — Z6841 Body Mass Index (BMI) 40.0 and over, adult: Secondary | ICD-10-CM | POA: Diagnosis not present

## 2020-09-16 DIAGNOSIS — R300 Dysuria: Secondary | ICD-10-CM | POA: Diagnosis not present

## 2020-09-16 DIAGNOSIS — N390 Urinary tract infection, site not specified: Secondary | ICD-10-CM | POA: Diagnosis not present

## 2020-09-16 DIAGNOSIS — Z6841 Body Mass Index (BMI) 40.0 and over, adult: Secondary | ICD-10-CM | POA: Diagnosis not present

## 2020-09-16 DIAGNOSIS — R635 Abnormal weight gain: Secondary | ICD-10-CM | POA: Diagnosis not present

## 2020-10-16 DIAGNOSIS — E538 Deficiency of other specified B group vitamins: Secondary | ICD-10-CM | POA: Diagnosis not present

## 2020-10-16 DIAGNOSIS — Z6841 Body Mass Index (BMI) 40.0 and over, adult: Secondary | ICD-10-CM | POA: Diagnosis not present

## 2020-10-16 DIAGNOSIS — R635 Abnormal weight gain: Secondary | ICD-10-CM | POA: Diagnosis not present

## 2020-11-19 DIAGNOSIS — Z6841 Body Mass Index (BMI) 40.0 and over, adult: Secondary | ICD-10-CM | POA: Diagnosis not present

## 2020-11-19 DIAGNOSIS — R635 Abnormal weight gain: Secondary | ICD-10-CM | POA: Diagnosis not present

## 2020-11-19 DIAGNOSIS — E538 Deficiency of other specified B group vitamins: Secondary | ICD-10-CM | POA: Diagnosis not present

## 2020-12-17 DIAGNOSIS — Z6841 Body Mass Index (BMI) 40.0 and over, adult: Secondary | ICD-10-CM | POA: Diagnosis not present

## 2020-12-17 DIAGNOSIS — F32A Depression, unspecified: Secondary | ICD-10-CM | POA: Diagnosis not present

## 2020-12-17 DIAGNOSIS — R635 Abnormal weight gain: Secondary | ICD-10-CM | POA: Diagnosis not present

## 2020-12-17 DIAGNOSIS — E538 Deficiency of other specified B group vitamins: Secondary | ICD-10-CM | POA: Diagnosis not present

## 2020-12-23 DIAGNOSIS — E559 Vitamin D deficiency, unspecified: Secondary | ICD-10-CM | POA: Diagnosis not present

## 2020-12-23 DIAGNOSIS — M170 Bilateral primary osteoarthritis of knee: Secondary | ICD-10-CM | POA: Diagnosis not present

## 2020-12-23 DIAGNOSIS — Z79899 Other long term (current) drug therapy: Secondary | ICD-10-CM | POA: Diagnosis not present

## 2020-12-23 DIAGNOSIS — Z6841 Body Mass Index (BMI) 40.0 and over, adult: Secondary | ICD-10-CM | POA: Diagnosis not present

## 2020-12-23 DIAGNOSIS — I10 Essential (primary) hypertension: Secondary | ICD-10-CM | POA: Diagnosis not present

## 2020-12-23 DIAGNOSIS — E7800 Pure hypercholesterolemia, unspecified: Secondary | ICD-10-CM | POA: Diagnosis not present

## 2020-12-23 DIAGNOSIS — I509 Heart failure, unspecified: Secondary | ICD-10-CM | POA: Diagnosis not present

## 2020-12-23 DIAGNOSIS — Z1159 Encounter for screening for other viral diseases: Secondary | ICD-10-CM | POA: Diagnosis not present

## 2020-12-23 DIAGNOSIS — Z8639 Personal history of other endocrine, nutritional and metabolic disease: Secondary | ICD-10-CM | POA: Diagnosis not present

## 2020-12-23 DIAGNOSIS — Z0000 Encounter for general adult medical examination without abnormal findings: Secondary | ICD-10-CM | POA: Diagnosis not present

## 2021-01-13 DIAGNOSIS — Z6841 Body Mass Index (BMI) 40.0 and over, adult: Secondary | ICD-10-CM | POA: Diagnosis not present

## 2021-01-13 DIAGNOSIS — I10 Essential (primary) hypertension: Secondary | ICD-10-CM | POA: Diagnosis not present

## 2021-01-13 DIAGNOSIS — R300 Dysuria: Secondary | ICD-10-CM | POA: Diagnosis not present

## 2021-01-13 DIAGNOSIS — I509 Heart failure, unspecified: Secondary | ICD-10-CM | POA: Diagnosis not present

## 2021-01-13 DIAGNOSIS — R3129 Other microscopic hematuria: Secondary | ICD-10-CM | POA: Diagnosis not present

## 2021-01-13 DIAGNOSIS — E7800 Pure hypercholesterolemia, unspecified: Secondary | ICD-10-CM | POA: Diagnosis not present

## 2021-01-19 DIAGNOSIS — E538 Deficiency of other specified B group vitamins: Secondary | ICD-10-CM | POA: Diagnosis not present

## 2021-01-19 DIAGNOSIS — Z6841 Body Mass Index (BMI) 40.0 and over, adult: Secondary | ICD-10-CM | POA: Diagnosis not present

## 2021-01-19 DIAGNOSIS — R635 Abnormal weight gain: Secondary | ICD-10-CM | POA: Diagnosis not present

## 2021-01-25 DIAGNOSIS — R3129 Other microscopic hematuria: Secondary | ICD-10-CM | POA: Diagnosis not present

## 2021-02-09 DIAGNOSIS — E890 Postprocedural hypothyroidism: Secondary | ICD-10-CM | POA: Diagnosis not present

## 2023-03-22 ENCOUNTER — Emergency Department (HOSPITAL_BASED_OUTPATIENT_CLINIC_OR_DEPARTMENT_OTHER): Payer: PPO

## 2023-03-22 ENCOUNTER — Encounter (HOSPITAL_BASED_OUTPATIENT_CLINIC_OR_DEPARTMENT_OTHER): Payer: Self-pay

## 2023-03-22 ENCOUNTER — Other Ambulatory Visit: Payer: Self-pay

## 2023-03-22 ENCOUNTER — Emergency Department (HOSPITAL_BASED_OUTPATIENT_CLINIC_OR_DEPARTMENT_OTHER)
Admission: EM | Admit: 2023-03-22 | Discharge: 2023-03-22 | Disposition: A | Discharge status: Home / Self Care | Payer: PPO | Attending: Emergency Medicine | Admitting: Emergency Medicine

## 2023-03-22 DIAGNOSIS — I130 Hypertensive heart and chronic kidney disease with heart failure and stage 1 through stage 4 chronic kidney disease, or unspecified chronic kidney disease: Secondary | ICD-10-CM | POA: Insufficient documentation

## 2023-03-22 DIAGNOSIS — N183 Chronic kidney disease, stage 3 unspecified: Secondary | ICD-10-CM | POA: Diagnosis not present

## 2023-03-22 DIAGNOSIS — Z79899 Other long term (current) drug therapy: Secondary | ICD-10-CM | POA: Diagnosis not present

## 2023-03-22 DIAGNOSIS — R0600 Dyspnea, unspecified: Secondary | ICD-10-CM | POA: Insufficient documentation

## 2023-03-22 DIAGNOSIS — R052 Subacute cough: Secondary | ICD-10-CM | POA: Insufficient documentation

## 2023-03-22 DIAGNOSIS — R0602 Shortness of breath: Secondary | ICD-10-CM | POA: Diagnosis present

## 2023-03-22 DIAGNOSIS — I5031 Acute diastolic (congestive) heart failure: Secondary | ICD-10-CM | POA: Insufficient documentation

## 2023-03-22 HISTORY — DX: Essential (primary) hypertension: I10

## 2023-03-22 LAB — CBC WITH DIFFERENTIAL/PLATELET
Abs Immature Granulocytes: 0.03 10*3/uL (ref 0.00–0.07)
Basophils Absolute: 0 10*3/uL (ref 0.0–0.1)
Basophils Relative: 1 %
Eosinophils Absolute: 0.3 10*3/uL (ref 0.0–0.5)
Eosinophils Relative: 3 %
HCT: 38.9 % (ref 36.0–46.0)
Hemoglobin: 12.5 g/dL (ref 12.0–15.0)
Immature Granulocytes: 0 %
Lymphocytes Relative: 31 %
Lymphs Abs: 2.6 10*3/uL (ref 0.7–4.0)
MCH: 31.2 pg (ref 26.0–34.0)
MCHC: 32.1 g/dL (ref 30.0–36.0)
MCV: 97 fL (ref 80.0–100.0)
Monocytes Absolute: 0.7 10*3/uL (ref 0.1–1.0)
Monocytes Relative: 8 %
Neutro Abs: 4.9 10*3/uL (ref 1.7–7.7)
Neutrophils Relative %: 57 %
Platelets: 227 10*3/uL (ref 150–400)
RBC: 4.01 MIL/uL (ref 3.87–5.11)
RDW: 14.3 % (ref 11.5–15.5)
WBC: 8.6 10*3/uL (ref 4.0–10.5)
nRBC: 0 % (ref 0.0–0.2)

## 2023-03-22 LAB — RESP PANEL BY RT-PCR (RSV, FLU A&B, COVID)  RVPGX2
Influenza A by PCR: NEGATIVE
Influenza B by PCR: NEGATIVE
Resp Syncytial Virus by PCR: NEGATIVE
SARS Coronavirus 2 by RT PCR: NEGATIVE

## 2023-03-22 LAB — BASIC METABOLIC PANEL
Anion gap: 5 (ref 5–15)
BUN: 14 mg/dL (ref 8–23)
CO2: 23 mmol/L (ref 22–32)
Calcium: 8.5 mg/dL — ABNORMAL LOW (ref 8.9–10.3)
Chloride: 108 mmol/L (ref 98–111)
Creatinine, Ser: 0.69 mg/dL (ref 0.44–1.00)
GFR, Estimated: >60 mL/min (ref >60)
Glucose, Bld: 90 mg/dL (ref 70–99)
Potassium: 3.8 mmol/L (ref 3.5–5.1)
Sodium: 136 mmol/L (ref 135–145)

## 2023-03-22 LAB — TROPONIN I (HIGH SENSITIVITY): Troponin I (High Sensitivity): 2 ng/L (ref <18)

## 2023-03-22 LAB — BRAIN NATRIURETIC PEPTIDE: B Natriuretic Peptide: 96.1 pg/mL (ref 0.0–100.0)

## 2023-03-22 MED ORDER — PREDNISONE 10 MG (21) PO TBPK: ORAL_TABLET | Freq: Every day | ORAL | 0 refills | Status: DC

## 2023-03-22 MED ORDER — ALBUTEROL SULFATE HFA 108 (90 BASE) MCG/ACT IN AERS: 2.0000 | INHALATION_SPRAY | RESPIRATORY_TRACT | Status: DC | PRN

## 2023-03-22 MED ORDER — IOHEXOL 350 MG/ML SOLN
100.0000 mL | Freq: Once | INTRAVENOUS | Status: AC | PRN
  Administered 2023-03-22: 100 mL via INTRAVENOUS

## 2023-03-22 MED ORDER — FUROSEMIDE 10 MG/ML IJ SOLN
20.0000 mg | Freq: Once | INTRAMUSCULAR | Status: AC
  Administered 2023-03-22: 20 mg via INTRAVENOUS
  Filled 2023-03-22: qty 2

## 2023-03-22 MED ORDER — ALBUTEROL SULFATE HFA 108 (90 BASE) MCG/ACT IN AERS: 1.0000 | INHALATION_SPRAY | Freq: Four times a day (QID) | RESPIRATORY_TRACT | 0 refills | Status: AC | PRN

## 2023-03-22 MED ORDER — IPRATROPIUM-ALBUTEROL 0.5-2.5 (3) MG/3ML IN SOLN: 3.0000 mL | Freq: Once | RESPIRATORY_TRACT | Status: AC

## 2023-03-22 MED ORDER — IPRATROPIUM-ALBUTEROL 0.5-2.5 (3) MG/3ML IN SOLN
RESPIRATORY_TRACT | Status: AC
  Administered 2023-03-22: 3 mL via RESPIRATORY_TRACT
  Filled 2023-03-22: qty 3

## 2023-03-22 MED ORDER — GUAIFENESIN-DM 100-10 MG/5ML PO SYRP: 5.0000 mL | ORAL_SOLUTION | ORAL | 0 refills | Status: DC | PRN

## 2023-03-22 NOTE — Discharge Instructions (Addendum)
It was a pleasure caring for you today in the emergency department.  Please follow up with your PCP  Please return to the emergency department for any worsening or worrisome symptoms.

## 2023-03-22 NOTE — ED Triage Notes (Signed)
Pt states that she had had some trouble breathing x 4 days. States some shortness of breath. Congested and has had a cough. Clear lung bases.

## 2023-03-22 NOTE — ED Notes (Signed)
4 days of SOB, took Mucinex for chest congestion and clear mucous upon coughing. No improvement. L axillary, R axillary and R side of chest discomfort with ROM and coughing.

## 2023-03-22 NOTE — ED Provider Notes (Signed)
[place] [facility] AT [facility] Provider Note  CSN: [id number] Arrival date & time: 03/22/23 1025  Chief Complaint(s) Shortness of Breath  HPI Teresa Blackburn is a 72 y.o. female with past medical history as below, significant for bronchitis, hypertension, obesity, diastolic heart failure, CKD and pulmonary hypertension who presents to the ED with complaint of cough, dyspnea  Having a cough intermittently productive over the past 2 weeks, congestion, postnasal drip, cough is somewhat improved.  She is not having difficulty breathing over the past 4 days, has some exertional dyspnea that improves with rest.  No chest pain, nausea vomiting fevers or chills.  She reports that her weight is been fluctuating, she is unsure if her dry weight.  Reports her leg swelling has been fluctuating.  Thinks her abdomen is slightly more bloated than normal.  But no abdominal pain.  Past Medical History Past Medical History:  Diagnosis Date   Bronchitis    Hypertension    Morbid obesity (HCC)    Patient Active Problem List   Diagnosis Date Noted   Trichomonal vaginitis 07/11/2013   UTI (urinary tract infection) 07/11/2013   Hypertensive urgency 07/09/2013   Acute respiratory failure with hypoxia (HCC) 07/09/2013   Acute diastolic heart failure, NYHA class 2 (HCC) 07/09/2013   Obesity, Class III, BMI 55 (morbid obesity) 07/09/2013   CKD (chronic kidney disease), stage III (HCC) 07/09/2013   Pulmonary HTN (HCC) 07/09/2013   Home Medication(s) Prior to Admission medications   Medication Sig Start Date End Date Taking? Authorizing Provider  albuterol (VENTOLIN HFA) 108 (90 Base) MCG/ACT inhaler Inhale 1-2 puffs into the lungs every 6 (six) hours as needed for wheezing or shortness of breath. 03/22/23  Yes Tanda Rockers A, DO  amLODipine (NORVASC) 10 MG tablet Take 1 tablet (10 mg total) by mouth daily. 07/11/13  Yes Russella Dar, NP  carvedilol (COREG) 3.125 MG tablet Take 1  tablet (3.125 mg total) by mouth 2 (two) times daily with a meal. 07/11/13  Yes Russella Dar, NP  ciprofloxacin (CIPRO) 500 MG tablet Take 1 tablet (500 mg total) by mouth 2 (two) times daily. 07/11/13  Yes Russella Dar, NP  guaiFENesin-dextromethorphan (ROBITUSSIN DM) 100-10 MG/5ML syrup Take 5 mLs by mouth every 4 (four) hours as needed for cough. 03/22/23  Yes Tanda Rockers A, DO  naproxen sodium (ANAPROX) 220 MG tablet Take 220 mg by mouth 2 (two) times daily as needed (pain).   Yes [provider]  predniSONE (STERAPRED UNI-PAK 21 TAB) 10 MG (21) TBPK tablet Take by mouth daily. Take 6 tabs by mouth daily  for 2 days, then 5 tabs for 2 days, then 4 tabs for 2 days, then 3 tabs for 2 days, 2 tabs for 2 days, then 1 tab by mouth daily for 2 days 03/22/23  Yes Sloan Leiter, DO  Past Surgical History Past Surgical History:  Procedure Laterality Date   CESAREAN SECTION     CHOLECYSTECTOMY     Family History No family history on file.  Social History Social History   Tobacco Use   Smoking status: Never  Substance Use Topics   Alcohol use: No   Drug use: No   Allergies Codeine  Review of Systems Review of Systems  Constitutional:  Negative for chills and fever.  HENT:  Positive for congestion and postnasal drip.   Respiratory:  Positive for cough and shortness of breath. Negative for chest tightness.   Cardiovascular:  Positive for leg swelling. Negative for chest pain and palpitations.  Gastrointestinal:  Negative for abdominal pain and diarrhea.  Genitourinary:  Negative for dysuria.  Musculoskeletal:  Negative for arthralgias.  Neurological:  Negative for light-headedness.  All other systems reviewed and are negative.   Physical Exam Vital Signs  I have reviewed the triage vital signs BP (!) 156/88   Pulse 70   Temp 98.2 F (36.8  C) (Oral)   Resp 18   Ht 5\' 2"  (1.575 m)   Wt 136.1 kg   SpO2 99%   BMI 54.87 kg/m  Physical Exam Vitals and nursing note reviewed.  Constitutional:      General: She is not in acute distress.    Appearance: Normal appearance. She is well-developed. She is obese.  HENT:     Head: Normocephalic and atraumatic.     Right Ear: External ear normal.     Left Ear: External ear normal.     Nose: Nose normal.     Mouth/Throat:     Mouth: Mucous membranes are moist.  Eyes:     General: No scleral icterus.       Right eye: No discharge.        Left eye: No discharge.  Cardiovascular:     Rate and Rhythm: Normal rate and regular rhythm.     Pulses: Normal pulses.     Heart sounds: Normal heart sounds.  Pulmonary:     Effort: Pulmonary effort is normal. No tachypnea, accessory muscle usage or respiratory distress.     Breath sounds: No stridor. Decreased breath sounds present.  Abdominal:     General: Abdomen is flat. There is no distension.     Palpations: Abdomen is soft.     Tenderness: There is no abdominal tenderness.  Musculoskeletal:     Cervical back: No rigidity.     Right lower leg: No edema.     Left lower leg: No edema.  Skin:    General: Skin is warm and dry.     Capillary Refill: Capillary refill takes less than 2 seconds.  Neurological:     Mental Status: She is alert.  Psychiatric:        Mood and Affect: Mood normal.        Behavior: Behavior normal. Behavior is cooperative.     ED Results and Treatments Labs (all labs ordered are listed, but only abnormal results are displayed) Labs Reviewed  BASIC METABOLIC PANEL - Abnormal; Notable for the following components:      Result Value   Calcium 8.5 (*)    All other components within normal limits  RESP PANEL BY RT-PCR (RSV, FLU A&B, COVID)  RVPGX2  CBC WITH DIFFERENTIAL/PLATELET  BRAIN NATRIURETIC PEPTIDE  TROPONIN I (HIGH SENSITIVITY)  Radiology CT Angio Chest PE W and/or Wo Contrast Result Date: 03/22/2023 CLINICAL DATA:  Difficulty breathing for 4 days. Shortness of breath with cough and congestion. Clinical concern for pulmonary embolism, high probability. EXAM: CT ANGIOGRAPHY CHEST WITH CONTRAST TECHNIQUE: Multidetector CT imaging of the chest was performed using the standard protocol during bolus administration of intravenous contrast. Multiplanar CT image reconstructions and MIPs were obtained to evaluate the vascular anatomy. RADIATION DOSE REDUCTION: This exam was performed according to the departmental dose-optimization program which includes automated exposure control, adjustment of the mA and/or kV according to patient size and/or use of iterative reconstruction technique. CONTRAST:  OMNIPAQUE IOHEXOL 350 MG/ML SOLN COMPARISON:  Chest radiographs 03/22/2023 and 08/14/2016 FINDINGS: Cardiovascular: The pulmonary arteries are well opacified with contrast to the level of the segmental branches. There is no evidence of acute pulmonary embolism. No evidence of acute or significant systemic arterial abnormality. Minimal calcification at the aortic isthmus. The heart size is normal. There is no pericardial effusion. Mediastinum/Nodes: There are no enlarged mediastinal, hilar or axillary lymph nodes.Evidence of previous thyroidectomy. The esophagus appears unremarkable. Lungs/Pleura: Chronic elevation of the right hemidiaphragm with associated chronic right basilar atelectasis. There is no confluent airspace disease, suspicious pulmonary nodule, pleural effusion or pneumothorax. Upper abdomen: No acute findings are seen in the visualized upper abdomen. Evidence of previous cholecystectomy with a possible 2.5 cm cyst inferiorly in the right hepatic lobe. The spleen appears mildly deviated medially, and the left kidney is not visualized, possibly surgically absent.  Musculoskeletal/Chest wall: There is no chest wall mass or suspicious osseous finding. Multilevel spondylosis. Review of the MIP images confirms the above findings. IMPRESSION: 1. No evidence of acute pulmonary embolism or other acute chest findings. 2. Chronic elevation of the right hemidiaphragm with associated chronic right basilar atelectasis. 3. No acute findings in the visualized upper abdomen. Electronically Signed   By: Carey Bullocks M.D.   On: 03/22/2023 15:12   DG Chest Port 1 View Result Date: 03/22/2023 CLINICAL DATA:  Shortness of breath with cough and congestion. EXAM: PORTABLE CHEST 1 VIEW COMPARISON:  08/14/2016 FINDINGS: Stable asymmetric elevation right hemidiaphragm. The lungs are clear without focal pneumonia, edema, pneumothorax or pleural effusion. Cardiopericardial silhouette is at upper limits of normal for size. No acute bony abnormality. IMPRESSION: No active disease. Electronically Signed   By: Kennith Center M.D.   On: 03/22/2023 12:25    Pertinent labs & imaging results that were available during my care of the patient were reviewed by me and considered in my medical decision making (see MDM for details).  Medications Ordered in ED Medications  albuterol (VENTOLIN HFA) 108 (90 Base) MCG/ACT inhaler 2 puff (has no administration in time range)  ipratropium-albuterol (DUONEB) 0.5-2.5 (3) MG/3ML nebulizer solution 3 mL (3 mLs Nebulization Given 03/22/23 1242)  furosemide (LASIX) injection 20 mg (20 mg Intravenous Given 03/22/23 1334)  iohexol (OMNIPAQUE) 350 MG/ML injection 100 mL (100 mLs Intravenous Contrast Given 03/22/23 1348)  Procedures Procedures  (including critical care time)  Medical Decision Making / ED Course    Medical Decision Making:    Cesily Costenbader is a 72 y.o. female with past medical history as below, significant  for bronchitis, hypertension, obesity, diastolic heart failure, CKD and pulmonary hypertension who presents to the ED with complaint of cough, dyspnea. The complaint involves an extensive differential diagnosis and also carries with it a high risk of complications and morbidity.  Serious etiology was considered. Ddx includes but is not limited to: In my evaluation of this patient's dyspnea my DDx includes, but is not limited to, pneumonia, pulmonary embolism, pneumothorax, pulmonary edema, metabolic acidosis, asthma, COPD, cardiac cause, anemia, anxiety, etc.    Complete initial physical exam performed, notably the patient was in no acute distress, no hypoxia.    Reviewed and confirmed nursing documentation for past medical history, family history, social history.  Vital signs reviewed.         Brief summary: 72 year old female history as above including pulmonary hypertension, diastolic heart failure, CKD here with dyspnea following presumed URI.  No hypoxia on assessment.  Lungs diminished bilateral, no rales or crackles.  Labs reviewed, these are stable, BNP is not elevated, troponin is not elevated.  EKG without acute ischemic changes.  Chest x-ray is unremarkable.  She does appear somewhat volume overloaded externally, will give 20 mg Lasix.  Will get CT angio to assess for PE.   Recheck she is feeling much better, imaging is stable.  She has no hypoxia.  Possible bronchitis.  Given antitussive for home, albuterol inhaler, give steroid taper, recommend she follow-up with PCP  The patient improved significantly and was discharged in stable condition. Detailed discussions were had with the patient regarding current findings, and need for close f/u with PCP or on call doctor. The patient has been instructed to return immediately if the symptoms worsen in any way for re-evaluation. Patient verbalized understanding and is in agreement with current care plan. All questions answered prior to  discharge.               Additional history obtained: -Additional history obtained from family -External records from outside source obtained and reviewed including: Chart review including previous notes, labs, imaging, consultation notes including  Home medications, primary care documentation, prior imaging   Lab Tests: -I ordered, reviewed, and interpreted labs.   The pertinent results include:   Labs Reviewed  BASIC METABOLIC PANEL - Abnormal; Notable for the following components:      Result Value   Calcium 8.5 (*)    All other components within normal limits  RESP PANEL BY RT-PCR (RSV, FLU A&B, COVID)  RVPGX2  CBC WITH DIFFERENTIAL/PLATELET  BRAIN NATRIURETIC PEPTIDE  TROPONIN I (HIGH SENSITIVITY)    Notable for labs stable  EKG   EKG Interpretation Date/Time:  Wednesday March 22 2023 13:09:34 EST Ventricular Rate:  77 PR Interval:  165 QRS Duration:  96 QT Interval:  403 QTC Calculation: 457 R Axis:   14  Text Interpretation: Sinus rhythm Low voltage, precordial leads Confirmed by Tanda Rockers ([id number]) on 03/22/2023 2:16:58 PM         Imaging Studies ordered: I ordered imaging studies including x-ray CHEST, CT PE I independently visualized the following imaging with scope of interpretation limited to determining acute life threatening conditions related to emergency care; findings noted above I independently visualized and interpreted imaging. I agree with the radiologist interpretation   Medicines ordered and prescription drug management: Meds  ordered this encounter  Medications   albuterol (VENTOLIN HFA) 108 (90 Base) MCG/ACT inhaler 2 puff   ipratropium-albuterol (DUONEB) 0.5-2.5 (3) MG/3ML nebulizer solution 3 mL   ipratropium-albuterol (DUONEB) 0.5-2.5 (3) MG/3ML nebulizer solution    Trinda Pascal B: cabinet override   furosemide (LASIX) injection 20 mg   iohexol (OMNIPAQUE) 350 MG/ML injection 100 mL   albuterol (VENTOLIN HFA) 108 (90  Base) MCG/ACT inhaler    Sig: Inhale 1-2 puffs into the lungs every 6 (six) hours as needed for wheezing or shortness of breath.    Dispense:  1 each    Refill:  0   guaiFENesin-dextromethorphan (ROBITUSSIN DM) 100-10 MG/5ML syrup    Sig: Take 5 mLs by mouth every 4 (four) hours as needed for cough.    Dispense:  118 mL    Refill:  0   predniSONE (STERAPRED UNI-PAK 21 TAB) 10 MG (21) TBPK tablet    Sig: Take by mouth daily. Take 6 tabs by mouth daily  for 2 days, then 5 tabs for 2 days, then 4 tabs for 2 days, then 3 tabs for 2 days, 2 tabs for 2 days, then 1 tab by mouth daily for 2 days    Dispense:  42 tablet    Refill:  0    -I have reviewed the patients home medicines and have made adjustments as needed   Consultations Obtained: NA   Cardiac Monitoring: The patient was maintained on a cardiac monitor.  I personally viewed and interpreted the cardiac monitored which showed an underlying rhythm of: nsr Continuous pulse oximetry interpreted by myself, 99% on RA.    Social Determinants of Health:  Diagnosis or treatment significantly limited by social determinants of health: obesity   Reevaluation: After the interventions noted above, I reevaluated the patient and found that they have improved  Co morbidities that complicate the patient evaluation  Past Medical History:  Diagnosis Date   Bronchitis    Hypertension    Morbid obesity (HCC)       Dispostion: Disposition decision including need for hospitalization was considered, and patient discharged from emergency department.    Final Clinical Impression(s) / ED Diagnoses Final diagnoses:  Subacute cough  Dyspnea, unspecified type        Sloan Leiter, DO 03/22/23 1613

## 2023-09-11 NOTE — Progress Notes (Signed)
 Subjective:  Patient ID: Teresa Blackburn is a 73 y.o. female   Chief Complaint: Pain of the Left Knee and Pain of the Right Knee   BP 143/89   Pulse 78   Temp 96.3 F (35.7 C) (Temporal)   Ht 1.524 m (5')   Wt (!) 140 kg (308 lb)   SpO2 98%   BMI 60.15 kg/m   The following information was reviewed by members of the visit team:  Tobacco  Allergies  Meds  Problems  Med Hx  Surg Hx  Fam Hx      Left Knee   Right Knee   Teresa Blackburn is a 73 y.o. female presents for evaluation of bilateral knee pain.  Patient states this has been going on for several years and she has had previous corticosteroid injections and gel injections which have provided relief.  Last gel injection was in December 2023 and patient states this lasted several months.  She has taken Tylenol  on and off over the last year and a half with minimal relief.  States activities like sitting to standing and extensive walking causes pain to bilateral knees.  Before last gel injections there was discussion on knee replacement but due to patient's weight this was not advised.  Patient is currently working on weight loss.  Patient denies nausea, vomiting fever, chills, shortness of breath.    Review of Systems  Constitutional:  Positive for activity change.  HENT: Negative.    Eyes: Negative.   Respiratory: Negative.    Cardiovascular: Negative.   Gastrointestinal: Negative.   Endocrine: Negative.   Genitourinary: Negative.   Musculoskeletal:  Positive for arthralgias, back pain, gait problem, joint swelling and myalgias.  Skin: Negative.   Allergic/Immunologic: Negative.   Hematological: Negative.   Psychiatric/Behavioral: Negative.      Objective: Right Knee Exam   Tenderness  The patient is experiencing tenderness in the lateral joint line and medial joint line.  Range of Motion  Extension:  normal  Flexion:  normal   Tests  Drawer:  Anterior - negative    Posterior - negative  Other  Sensation:  normal Pulse: present Swelling: moderate   Left Knee Exam   Tenderness  The patient is experiencing tenderness in the lateral joint line and medial joint line.  Range of Motion  Extension:  normal  Flexion:  normal   Tests  Drawer:  Anterior - negative     Posterior - negative  Other  Sensation: normal Pulse: present Swelling: moderate     Procedure: Large joint arthrocentesis: bilateral knee on 09/11/2023 11:00 AMIndications: pain Details: 22 G needle, anteromedial approach Medications (Right): 1 mL dexAMETHasone  4 mg/mL; 3 mL lidocaine 10 mg/mL (1 %) Medications (Left): 1 mL dexAMETHasone  4 mg/mL; 3 mL lidocaine 10 mg/mL (1 %) Outcome: tolerated well, no immediate complications  After verbal informed consent and sterile prep each knee was injected with 1 mL of dexamethasone  and 3 mL of lidocaine plain without difficulty. Procedure, treatment alternatives, risks and benefits explained, specific risks discussed. Consent was given by the patient. Immediately prior to procedure a time out was called to verify the correct patient, procedure, equipment, support staff and site/side marked as required. Patient was prepped and draped in the usual sterile fashion.     Imaging Results: XR Knees Anteroposterior Standing Bilateral Result Date: 09/11/2023 Prominent bilateral medial joint interval narrowing with osteophytes and irregularity.  Lateral osteophytes and irregularity but without narrowing.  No fracture or bony lesion other findings. Impression: Arthritis  both knees.  XR Knee 1 Or 2 Vw Bilateral Result Date: 09/11/2023 Left worse than right patellofemoral arthritis with osteophytes and subchondral irregularity and lateral narrowing.  No fracture or bony lesion or other findings. Impression: Arthritis both knees.   Assessment/Plan: Problem List Items Addressed This Visit     Morbid obesity (HCC)   Primary osteoarthritis of right knee   Primary osteoarthritis of left knee    Other Visit Diagnoses       Bilateral chronic knee pain    -  Primary   Relevant Orders   XR Knees Anteroposterior Standing Bilateral (Completed)   XR Knee 1 Or 2 Vw Bilateral (Completed)      -Patient examined and evaluated regarding bilateral knee pain. - Radiographic and clinical exam findings reviewed with patient.  X-rays reveal arthritis to both knees with medial joint space narrowing noted. - Discussed treatment options including conservative versus surgical intervention.  At this time due to patient's weight surgical intervention is not advised.  We did discuss use of oral anti-inflammatories, corticosteroid injection, and gel injections as needed.  Patient would like to move forward with corticosteroid injections today to bilateral knees.  See procedure note for further details. - Recommend continued activity as tolerated with no restrictions. - Patient to follow-up as needed or sooner should any acute issues arise or if she would like to move forward with gel injections.  Patient expressed understanding and all questions answered.    Electronically Signed By: Jorene Fairy Olives  Mon 09/11/2023 11:49 AM   Electronically signed by:  Jorene Fairy Olives, DPM 09/11/2023 11:49 AM  Today I saw this patient together with Dr. Olives, D.P.M.  I reviewed his note and agree with it.  She is a 73 year old who returns with years of knee pain worse recently.  There are anterior and medial pains.  Exam demonstrates that the knees are most tender medial and have motion between 0 and close to 100 degrees.  The stable and have good strength.  Questionable if any effusion.  Hip motions do not hurt.  Today I reviewed her labs.  On January 12, 2022 her creatinine 0.77 with a GFR of 83 and her hemoglobin was 13.8.  Today I reviewed her note from [place] from May 17, 2021.  Impression: Primary osteoarthritis of her knees in a patient with morbid obesity.  This represents  exacerbation of a chronic medical condition.  Plan: She is working on weight loss.  Go ahead with injection of both knees and Mobic 15 mg daily.  Treatment risk category moderate.  If she struggles I asked her to call as we would seek approval for Synvisc series or similar viscosupplementation. The patient has arthritis on x-ray, has not responded to stretching and strengthening and exercise and activity modification, has not responded well to medical management, has received temporary relief from cortisone injection, and has had a clear yellow aspirate.

## 2023-10-18 ENCOUNTER — Emergency Department (HOSPITAL_BASED_OUTPATIENT_CLINIC_OR_DEPARTMENT_OTHER)

## 2023-10-18 ENCOUNTER — Inpatient Hospital Stay (HOSPITAL_BASED_OUTPATIENT_CLINIC_OR_DEPARTMENT_OTHER)
Admission: EM | Admit: 2023-10-18 | Discharge: 2023-10-20 | DRG: 392 | Disposition: A | Discharge status: Home / Self Care | Attending: Internal Medicine | Admitting: Internal Medicine

## 2023-10-18 ENCOUNTER — Encounter (HOSPITAL_BASED_OUTPATIENT_CLINIC_OR_DEPARTMENT_OTHER): Payer: Self-pay | Admitting: Emergency Medicine

## 2023-10-18 ENCOUNTER — Other Ambulatory Visit: Payer: Self-pay

## 2023-10-18 DIAGNOSIS — I110 Hypertensive heart disease with heart failure: Secondary | ICD-10-CM | POA: Diagnosis present

## 2023-10-18 DIAGNOSIS — G4730 Sleep apnea, unspecified: Secondary | ICD-10-CM | POA: Diagnosis present

## 2023-10-18 DIAGNOSIS — Z79899 Other long term (current) drug therapy: Secondary | ICD-10-CM

## 2023-10-18 DIAGNOSIS — R1084 Generalized abdominal pain: Principal | ICD-10-CM

## 2023-10-18 DIAGNOSIS — Z885 Allergy status to narcotic agent status: Secondary | ICD-10-CM

## 2023-10-18 DIAGNOSIS — K5792 Diverticulitis of intestine, part unspecified, without perforation or abscess without bleeding: Secondary | ICD-10-CM

## 2023-10-18 DIAGNOSIS — I509 Heart failure, unspecified: Secondary | ICD-10-CM | POA: Diagnosis present

## 2023-10-18 DIAGNOSIS — E66813 Obesity, class 3: Secondary | ICD-10-CM | POA: Diagnosis present

## 2023-10-18 DIAGNOSIS — Z6841 Body Mass Index (BMI) 40.0 and over, adult: Secondary | ICD-10-CM

## 2023-10-18 DIAGNOSIS — I10 Essential (primary) hypertension: Secondary | ICD-10-CM | POA: Diagnosis present

## 2023-10-18 DIAGNOSIS — Z79890 Hormone replacement therapy (postmenopausal): Secondary | ICD-10-CM

## 2023-10-18 DIAGNOSIS — K5720 Diverticulitis of large intestine with perforation and abscess without bleeding: Secondary | ICD-10-CM | POA: Diagnosis not present

## 2023-10-18 DIAGNOSIS — Z9049 Acquired absence of other specified parts of digestive tract: Secondary | ICD-10-CM

## 2023-10-18 DIAGNOSIS — E039 Hypothyroidism, unspecified: Secondary | ICD-10-CM | POA: Diagnosis present

## 2023-10-18 HISTORY — DX: Heart failure, unspecified: I50.9

## 2023-10-18 LAB — BASIC METABOLIC PANEL WITH GFR
Anion gap: 11 (ref 5–15)
BUN: 7 mg/dL — ABNORMAL LOW (ref 8–23)
CO2: 24 mmol/L (ref 22–32)
Calcium: 8.9 mg/dL (ref 8.9–10.3)
Chloride: 104 mmol/L (ref 98–111)
Creatinine, Ser: 0.68 mg/dL (ref 0.44–1.00)
GFR, Estimated: >60 mL/min (ref >60)
Glucose, Bld: 87 mg/dL (ref 70–99)
Potassium: 4.3 mmol/L (ref 3.5–5.1)
Sodium: 139 mmol/L (ref 135–145)

## 2023-10-18 LAB — URINALYSIS, W/ REFLEX TO CULTURE (INFECTION SUSPECTED)
Bilirubin Urine: NEGATIVE
Glucose, UA: NEGATIVE mg/dL
Ketones, ur: NEGATIVE mg/dL
Nitrite: NEGATIVE
Protein, ur: NEGATIVE mg/dL
Specific Gravity, Urine: 1.01 (ref 1.005–1.030)
pH: 6 (ref 5.0–8.0)

## 2023-10-18 LAB — HEPATIC FUNCTION PANEL
ALT: 15 U/L (ref 0–44)
AST: 20 U/L (ref 15–41)
Albumin: 3.8 g/dL (ref 3.5–5.0)
Alkaline Phosphatase: 68 U/L (ref 38–126)
Bilirubin, Direct: 0.3 mg/dL — ABNORMAL HIGH (ref 0.0–0.2)
Indirect Bilirubin: 0.5 mg/dL (ref 0.3–0.9)
Total Bilirubin: 0.8 mg/dL (ref 0.0–1.2)
Total Protein: 7.4 g/dL (ref 6.5–8.1)

## 2023-10-18 LAB — LIPASE, BLOOD: Lipase: 21 U/L (ref 11–51)

## 2023-10-18 LAB — CBC WITH DIFFERENTIAL/PLATELET
Abs Immature Granulocytes: 0.04 K/uL (ref 0.00–0.07)
Basophils Absolute: 0 K/uL (ref 0.0–0.1)
Basophils Relative: 0 %
Eosinophils Absolute: 0.3 K/uL (ref 0.0–0.5)
Eosinophils Relative: 2 %
HCT: 39.4 % (ref 36.0–46.0)
Hemoglobin: 12.9 g/dL (ref 12.0–15.0)
Immature Granulocytes: 0 %
Lymphocytes Relative: 15 %
Lymphs Abs: 1.7 K/uL (ref 0.7–4.0)
MCH: 30.9 pg (ref 26.0–34.0)
MCHC: 32.7 g/dL (ref 30.0–36.0)
MCV: 94.3 fL (ref 80.0–100.0)
Monocytes Absolute: 1.1 K/uL — ABNORMAL HIGH (ref 0.1–1.0)
Monocytes Relative: 10 %
Neutro Abs: 7.7 K/uL (ref 1.7–7.7)
Neutrophils Relative %: 73 %
Platelets: 265 K/uL (ref 150–400)
RBC: 4.18 MIL/uL (ref 3.87–5.11)
RDW: 14.2 % (ref 11.5–15.5)
WBC: 10.9 K/uL — ABNORMAL HIGH (ref 4.0–10.5)
nRBC: 0 % (ref 0.0–0.2)

## 2023-10-18 MED ORDER — SODIUM CHLORIDE 0.9% FLUSH
3.0000 mL | Freq: Two times a day (BID) | INTRAVENOUS | Status: DC
  Administered 2023-10-18 – 2023-10-20 (×3): 3 mL via INTRAVENOUS

## 2023-10-18 MED ORDER — SODIUM CHLORIDE 0.9 % IV SOLN: 250.0000 mL | INTRAVENOUS | Status: AC | PRN

## 2023-10-18 MED ORDER — ONDANSETRON HCL 4 MG PO TABS: 4.0000 mg | ORAL_TABLET | Freq: Four times a day (QID) | ORAL | Status: DC | PRN

## 2023-10-18 MED ORDER — PIPERACILLIN-TAZOBACTAM 3.375 G IVPB 30 MIN
3.3750 g | Freq: Once | INTRAVENOUS | Status: AC
  Administered 2023-10-18: 3.375 g via INTRAVENOUS
  Filled 2023-10-18: qty 50

## 2023-10-18 MED ORDER — SODIUM CHLORIDE 0.9 % IV BOLUS
500.0000 mL | Freq: Once | INTRAVENOUS | Status: AC
  Administered 2023-10-18: 500 mL via INTRAVENOUS

## 2023-10-18 MED ORDER — ONDANSETRON HCL 4 MG/2ML IJ SOLN
4.0000 mg | Freq: Once | INTRAMUSCULAR | Status: AC
  Administered 2023-10-18: 4 mg via INTRAVENOUS
  Filled 2023-10-18: qty 2

## 2023-10-18 MED ORDER — SODIUM CHLORIDE 0.9% FLUSH: 3.0000 mL | INTRAVENOUS | Status: DC | PRN

## 2023-10-18 MED ORDER — HYDROMORPHONE HCL 1 MG/ML IJ SOLN
0.5000 mg | Freq: Once | INTRAMUSCULAR | Status: AC
  Administered 2023-10-18: 0.5 mg via INTRAVENOUS
  Filled 2023-10-18: qty 1

## 2023-10-18 MED ORDER — ENOXAPARIN SODIUM 60 MG/0.6ML IJ SOSY
60.0000 mg | PREFILLED_SYRINGE | INTRAMUSCULAR | Status: DC
  Administered 2023-10-18: 60 mg via SUBCUTANEOUS
  Filled 2023-10-18: qty 0.6

## 2023-10-18 MED ORDER — ACETAMINOPHEN 650 MG RE SUPP: 650.0000 mg | Freq: Four times a day (QID) | RECTAL | Status: DC | PRN

## 2023-10-18 MED ORDER — HYDROMORPHONE HCL 1 MG/ML IJ SOLN: 0.5000 mg | INTRAMUSCULAR | Status: DC | PRN

## 2023-10-18 MED ORDER — CARVEDILOL 3.125 MG PO TABS
3.1250 mg | ORAL_TABLET | Freq: Two times a day (BID) | ORAL | Status: DC
  Administered 2023-10-19 – 2023-10-20 (×2): 3.125 mg via ORAL
  Filled 2023-10-18 (×2): qty 1

## 2023-10-18 MED ORDER — IOHEXOL 300 MG/ML  SOLN
100.0000 mL | Freq: Once | INTRAMUSCULAR | Status: AC | PRN
  Administered 2023-10-18: 100 mL via INTRAVENOUS

## 2023-10-18 MED ORDER — HYDROCODONE-ACETAMINOPHEN 5-325 MG PO TABS: 1.0000 | ORAL_TABLET | ORAL | Status: DC | PRN

## 2023-10-18 MED ORDER — PIPERACILLIN-TAZOBACTAM 3.375 G IVPB
3.3750 g | Freq: Three times a day (TID) | INTRAVENOUS | Status: DC
  Administered 2023-10-18 – 2023-10-20 (×5): 3.375 g via INTRAVENOUS
  Filled 2023-10-18 (×6): qty 50

## 2023-10-18 MED ORDER — ALBUTEROL SULFATE HFA 108 (90 BASE) MCG/ACT IN AERS: 1.0000 | INHALATION_SPRAY | Freq: Four times a day (QID) | RESPIRATORY_TRACT | Status: DC | PRN

## 2023-10-18 MED ORDER — ACETAMINOPHEN 325 MG PO TABS
650.0000 mg | ORAL_TABLET | Freq: Four times a day (QID) | ORAL | Status: DC | PRN
  Administered 2023-10-19: 650 mg via ORAL
  Filled 2023-10-18 (×3): qty 2

## 2023-10-18 MED ORDER — ALBUTEROL SULFATE (2.5 MG/3ML) 0.083% IN NEBU: 2.5000 mg | INHALATION_SOLUTION | Freq: Four times a day (QID) | RESPIRATORY_TRACT | Status: DC | PRN

## 2023-10-18 MED ORDER — LOSARTAN POTASSIUM 25 MG PO TABS
25.0000 mg | ORAL_TABLET | Freq: Every day | ORAL | Status: DC
  Administered 2023-10-19 – 2023-10-20 (×2): 25 mg via ORAL
  Filled 2023-10-18 (×3): qty 1

## 2023-10-18 MED ORDER — SODIUM CHLORIDE 0.9 % IV SOLN: INTRAVENOUS | Status: AC

## 2023-10-18 MED ORDER — LEVOTHYROXINE SODIUM 75 MCG PO TABS
150.0000 ug | ORAL_TABLET | Freq: Every day | ORAL | Status: DC
  Administered 2023-10-19 – 2023-10-20 (×2): 150 ug via ORAL
  Filled 2023-10-18 (×2): qty 2

## 2023-10-18 MED ORDER — AMLODIPINE BESYLATE 10 MG PO TABS: 10.0000 mg | ORAL_TABLET | Freq: Every day | ORAL | Status: DC

## 2023-10-18 MED ORDER — ONDANSETRON HCL 4 MG/2ML IJ SOLN: 4.0000 mg | Freq: Four times a day (QID) | INTRAMUSCULAR | Status: DC | PRN

## 2023-10-18 NOTE — Progress Notes (Signed)
 [facility] Admits Paged for MD to be aware patient has arrived to floor  Patient states upon getting to floor that pain is 3/10 in her abdomen. Also that she wears a CPAP at night.

## 2023-10-18 NOTE — ED Provider Notes (Signed)
 [place] [facility] Provider Note   CSN: [id number] Arrival date & time: 10/18/23  1312     Patient presents with: Abdominal Pain   Teresa Blackburn is a 73 y.o. female.   73 year old female presents for evaluation of abdominal pain.  States has been going on since Friday but getting worse.  States is located all over but worse in the left lower quadrant.  States she had her gallbladder removed in the 90s.  She admits to some nausea but no vomiting and states she has diarrhea that started today.  Denies any other symptoms or concerns at this time.   Abdominal Pain Associated symptoms: diarrhea and nausea   Associated symptoms: no chest pain, no chills, no cough, no dysuria, no fever, no hematuria, no shortness of breath, no sore throat and no vomiting        Prior to Admission medications   Medication Sig Start Date End Date Taking? Authorizing Provider  albuterol  (VENTOLIN  HFA) 108 (90 Base) MCG/ACT inhaler Inhale 1-2 puffs into the lungs every 6 (six) hours as needed for wheezing or shortness of breath. 03/22/23   Elnor Jayson LABOR, DO  amLODipine  (NORVASC ) 10 MG tablet Take 1 tablet (10 mg total) by mouth daily. 07/11/13   Alto Isaiah CROME, NP  carvedilol  (COREG ) 3.125 MG tablet Take 1 tablet (3.125 mg total) by mouth 2 (two) times daily with a meal. 07/11/13   Alto Isaiah CROME, NP  ciprofloxacin  (CIPRO ) 500 MG tablet Take 1 tablet (500 mg total) by mouth 2 (two) times daily. 07/11/13   Alto Isaiah CROME, NP  guaiFENesin -dextromethorphan (ROBITUSSIN DM) 100-10 MG/5ML syrup Take 5 mLs by mouth every 4 (four) hours as needed for cough. 03/22/23   Elnor Jayson LABOR, DO  naproxen sodium (ANAPROX) 220 MG tablet Take 220 mg by mouth 2 (two) times daily as needed (pain).    [provider]  predniSONE  (STERAPRED UNI-PAK 21 TAB) 10 MG (21) TBPK tablet Take by mouth daily. Take 6 tabs by mouth daily  for 2 days, then 5 tabs for 2 days, then 4 tabs for 2 days,  then 3 tabs for 2 days, 2 tabs for 2 days, then 1 tab by mouth daily for 2 days 03/22/23   Elnor Jayson LABOR, DO    Allergies: Codeine    Review of Systems  Constitutional:  Negative for chills and fever.  HENT:  Negative for ear pain and sore throat.   Eyes:  Negative for pain and visual disturbance.  Respiratory:  Negative for cough and shortness of breath.   Cardiovascular:  Negative for chest pain and palpitations.  Gastrointestinal:  Positive for abdominal pain, diarrhea and nausea. Negative for vomiting.  Genitourinary:  Negative for dysuria and hematuria.  Musculoskeletal:  Negative for arthralgias and back pain.  Skin:  Negative for color change and rash.  Neurological:  Negative for seizures and syncope.  All other systems reviewed and are negative.   Updated Vital Signs BP 134/87 (BP Location: Left Arm)   Pulse 98   Temp 98.3 F (36.8 C)   Resp 18   Ht 5' 2 (1.575 m)   Wt (!) 140 kg   SpO2 99%   BMI 56.45 kg/m   Physical Exam Vitals and nursing note reviewed.  Constitutional:      General: She is not in acute distress.    Appearance: She is well-developed. She is not ill-appearing.  HENT:     Head: Normocephalic and atraumatic.  Eyes:     Conjunctiva/sclera: Conjunctivae normal.  Cardiovascular:     Rate and Rhythm: Normal rate and regular rhythm.     Heart sounds: No murmur heard. Pulmonary:     Effort: Pulmonary effort is normal. No respiratory distress.     Breath sounds: Normal breath sounds.  Abdominal:     Palpations: Abdomen is soft.     Tenderness: There is generalized abdominal tenderness and tenderness in the left lower quadrant.  Musculoskeletal:        General: No swelling.     Cervical back: Neck supple.  Skin:    General: Skin is warm and dry.     Capillary Refill: Capillary refill takes less than 2 seconds.  Neurological:     Mental Status: She is alert.  Psychiatric:        Mood and Affect: Mood normal.     (all labs ordered are  listed, but only abnormal results are displayed) Labs Reviewed  LIPASE, BLOOD  BASIC METABOLIC PANEL WITH GFR  CBC WITH DIFFERENTIAL/PLATELET  HEPATIC FUNCTION PANEL    EKG: None  Radiology: No results found.   Procedures   Medications Ordered in the ED  sodium chloride  0.9 % bolus 500 mL (has no administration in time range)  ondansetron  (ZOFRAN ) injection 4 mg (4 mg Intravenous Given 10/18/23 1438)  HYDROmorphone  (DILAUDID ) injection 0.5 mg (0.5 mg Intravenous Given 10/18/23 1438)                                    Medical Decision Making Medical Decision Making Nursing notes are reviewed. Differential diagnosis for this patient would include but not limited to: Diverticulitis, gastroenteritis, constipation, appendicitis, obstruction, pancreatitis, other  Emergency Department Course:  Vital signs and pulse oximetry are reviewed, evaluated by myself and found to be within normal limits prior to final disposition. Findings of laboratory testing and medical imaging are discussed with patient and family that is available. Patient agrees with the medical care plan as follows:  Patient here for abdominal pain.  Has been going on a few days she has most pain in the left lower quadrant but she is generally tender.  Will give her Dilaudid  Zofran  and IV fluids.  Labs and CT scan of the abdomen pelvis ordered. Patient signed out to oncoming provider at 3 pm pending remainder of workup and ultimate disposition.    Problems Addressed: Generalized abdominal pain: undiagnosed new problem with uncertain prognosis  Amount and/or Complexity of Data Reviewed External Data Reviewed: notes.    Details: Prior ED records reviewed and patient last seen in the ER in December 2024 for dyspnea  Labs: ordered. Radiology: ordered.  Risk OTC drugs. Prescription drug management. Parenteral controlled substances. Drug therapy requiring intensive monitoring for toxicity.     Final  diagnoses:  Generalized abdominal pain    ED Discharge Orders     None          Gennaro Duwaine CROME, DO 10/18/23 1441

## 2023-10-18 NOTE — Progress Notes (Signed)
 Plan of Care Note for accepted transfer   Patient: Teresa Blackburn MRN: [id number]   DOA: 10/18/2023  Facility requesting transfer: [facility] Requesting Provider: Dr. Lenor Reason for transfer: Diverticulitis Facility course:  Patient is a 73 year old female with history of HTN, obesity who presented to ED with generalized abdominal pain, increased in the LLQ.  CT shows acute diverticulitis at the junction of the proximal sigmoid and descending colon with findings suggestive of intramural abscess measuring 1.8 x 2.4 x 3.6 cm.  No perforation, pneumoperitoneum, pneumatosis, or portal venous gas.  Patient was given IV Zosyn  and 500 cc NS.  EDP spoke with general surgery (Dr. Curvin) who recommended medical admission and their team will see in consultation.  The hospital service was consulted tonight.  Plan of care: The patient is accepted for admission to Med-surg  unit, at [facility].  Author: Jorie Blanch, MD 10/18/2023  Check [email or web address] for on-call coverage.  Nursing staff, Please call [facility] Admits & Consults System-Wide number on Amion as soon as patient's arrival, so appropriate admitting provider can evaluate the pt.

## 2023-10-18 NOTE — ED Notes (Signed)
 Asked patient for urine sample unable to obtain. Pt stated not having to urinate at the time being asked.

## 2023-10-18 NOTE — ED Notes (Signed)
[facility] at bedside to transport pt

## 2023-10-18 NOTE — H&P (Signed)
 [facility] History and Physical  Teresa Blackburn DOB: [birth year] DOA: 10/18/2023  Referring physician: ED  PCP: Claudene Round, MD   Patient is coming from: Home  Chief Complaint: Abdominal pain  HPI:  Patient is a 73 years old female with past medical history of hypertension, sleep apnea, hypothyroidism and morbid obesity presented to the hospital with complaints of abdominal pain mostly over the left lower quadrant since Friday.  It was associated with nausea but no vomiting.  Patient normally has loose stools 2-3 times a day at home but recently has not had bowel movement in couple of days.  Patient does have history of gallbladder removal in the 90s.  Patient denied any chest pain, shortness of breath, dyspnea.  Denied any fever chills or rigor.  Denied any urinary urgency, frequency or dysuria.  In the ED, patient had stable vitals.  Labs were notable for mild leukocytosis with WBC at 10.9.  Lipase was 21.  Urinalysis with hemoglobin but no nitrite.  WBC 6-10.  LFTs within normal limits.  CT scan of the abdomen and pelvis was done which showed acute diverticulitis with intramural wall abscess.  General surgery was consulted and patient was admitted to the hospital for further evaluation and treatment.  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  Continue n.p.o. with sips and meds, IV analgesics, antiemetics.  Continue IV fluids overnight.  General surgery Dr. Curvin was notified at [facility].  General surgery to follow the patient.  Continue IV antibiotics with Zosyn .  Will consult pharmacy Zosyn  was given in the ED.  History of hypertension. Patient is on losartan  and Coreg  at home.  Will continue Coreg  while in the hospital.  Uncertain dose on  losartan  so we will start with low-dose for now..  Med rec pending.  Class III obesity. Body mass index is 56.45 kg/m.  Lifestyle modification advised.  History of sleep apnea.  Continue CPAP.  History of hypothyroidism.  Status post thyroid  surgery.  Patient takes Synthroid  150 mcg daily as per the patient..  Will add Synthroid .  Med rec still pending.  History of congestive heart failure as per the patient.  Currently compensated.  Takes Lasix  at home but uncertain dose..  Will hold off with Lasix  for tonight.   DVT Prophylaxis: Lovenox  subcu  Review of Systems:  All systems were reviewed and were negative unless otherwise mentioned in the HPI   Past Medical History:  Diagnosis Date   Bronchitis    Hypertension    Morbid obesity (HCC)    Past Surgical History:  Procedure Laterality Date   CESAREAN SECTION     CHOLECYSTECTOMY      Social History:  reports that she has never smoked. She does not have any smokeless tobacco history on file. She reports that she does not drink alcohol  and does not use drugs.  Allergies  Allergen Reactions   Codeine Other (See Comments)    Gives a overwhelming feeling    History reviewed. No pertinent family history.   Prior to Admission medications   Medication Sig Start Date End Date Taking? Authorizing Provider  albuterol  (VENTOLIN  HFA) 108 (90 Base) MCG/ACT inhaler Inhale 1-2 puffs into the lungs every 6 (six) hours as needed for wheezing or shortness of breath. 03/22/23   Elnor Savant  A, DO  amLODipine  (NORVASC ) 10 MG tablet Take 1 tablet (10 mg total) by mouth daily. 07/11/13   Alto Isaiah CROME, NP  carvedilol  (COREG ) 3.125 MG tablet Take 1 tablet (3.125 mg total) by mouth 2 (two) times daily with a meal. 07/11/13   Alto Isaiah CROME, NP  ciprofloxacin  (CIPRO ) 500 MG tablet Take 1 tablet (500 mg total) by mouth 2 (two) times daily. 07/11/13   Alto Isaiah CROME, NP  guaiFENesin -dextromethorphan (ROBITUSSIN DM) 100-10 MG/5ML syrup Take 5 mLs by  mouth every 4 (four) hours as needed for cough. 03/22/23   Elnor Jayson LABOR, DO  naproxen sodium (ANAPROX) 220 MG tablet Take 220 mg by mouth 2 (two) times daily as needed (pain).    [provider]  predniSONE  (STERAPRED UNI-PAK 21 TAB) 10 MG (21) TBPK tablet Take by mouth daily. Take 6 tabs by mouth daily  for 2 days, then 5 tabs for 2 days, then 4 tabs for 2 days, then 3 tabs for 2 days, 2 tabs for 2 days, then 1 tab by mouth daily for 2 days 03/22/23   Elnor Jayson LABOR, DO    Physical Exam:  Vitals:   10/18/23 1318 10/18/23 1319 10/18/23 1626 10/18/23 2016  BP:  134/87 103/72 128/80  Pulse:  98 76 82  Resp:  18 18 18   Temp:  98.3 F (36.8 C) 98.2 F (36.8 C) 98.5 F (36.9 C)  TempSrc:   Oral Oral  SpO2:  99% 97% 100%  Weight: (!) 140 kg     Height: 5' 2 (1.575 m)      Wt Readings from Last 3 Encounters:  10/18/23 (!) 140 kg  03/22/23 136.1 kg  07/11/13 (!) 151.2 kg   Body mass index is 56.45 kg/m.  General: Obese built, not in obvious distress HENT: Normocephalic, No scleral pallor or icterus noted. Oral mucosa is moist.  Chest:  Clear breath sounds.  . No crackles or wheezes.  CVS: S1 &S2 heard. No murmur.  Regular rate and rhythm. Abdomen: Soft, tenderness over the left lower quadrant, bowel sounds are heard. No abdominal mass palpated Extremities: No cyanosis, clubbing or edema.  Peripheral pulses are palpable. Psych: Alert, awake and oriented, normal mood CNS:  No cranial nerve deficits.  Power equal in all extremities.   Skin: Warm and dry.  No rashes noted.  Labs on Admission:   CBC: Recent Labs  Lab 10/18/23 1425  WBC 10.9*  NEUTROABS 7.7  HGB 12.9  HCT 39.4  MCV 94.3  PLT 265    Basic Metabolic Panel: Recent Labs  Lab 10/18/23 1425  NA 139  K 4.3  CL 104  CO2 24  GLUCOSE 87  BUN 7*  CREATININE 0.68  CALCIUM 8.9    Liver Function Tests: Recent Labs  Lab 10/18/23 1425  AST 20  ALT 15  ALKPHOS 68  BILITOT 0.8  PROT 7.4   ALBUMIN 3.8   Recent Labs  Lab 10/18/23 1425  LIPASE 21   No results for input(s): AMMONIA in the last 168 hours.  Cardiac Enzymes: No results for input(s): CKTOTAL, CKMB, CKMBINDEX, TROPONINI in the last 168 hours.  BNP (last 3 results) Recent Labs    03/22/23 1114  BNP 96.1    ProBNP (last 3 results) No results for input(s): PROBNP in the last 8760 hours.  CBG: No results for input(s): GLUCAP in the last 168 hours.  Lipase     Component Value Date/Time   LIPASE 21 10/18/2023 1425  Urinalysis    Component Value Date/Time   COLORURINE YELLOW 10/18/2023 1626   APPEARANCEUR CLEAR 10/18/2023 1626   LABSPEC 1.010 10/18/2023 1626   PHURINE 6.0 10/18/2023 1626   GLUCOSEU NEGATIVE 10/18/2023 1626   HGBUR MODERATE (A) 10/18/2023 1626   BILIRUBINUR NEGATIVE 10/18/2023 1626   KETONESUR NEGATIVE 10/18/2023 1626   PROTEINUR NEGATIVE 10/18/2023 1626   UROBILINOGEN 1.0 07/10/2013 1348   NITRITE NEGATIVE 10/18/2023 1626   LEUKOCYTESUR TRACE (A) 10/18/2023 1626     Drugs of Abuse  No results found for: LABOPIA, COCAINSCRNUR, LABBENZ, AMPHETMU, THCU, LABBARB    Radiological Exams on Admission: CT ABDOMEN PELVIS W CONTRAST Result Date: 10/18/2023 CLINICAL DATA:  abd pain, llq worst, generalized.  Nausea. EXAM: CT ABDOMEN AND PELVIS WITH CONTRAST TECHNIQUE: Multidetector CT imaging of the abdomen and pelvis was performed using the standard protocol following bolus administration of intravenous contrast. RADIATION DOSE REDUCTION: This exam was performed according to the departmental dose-optimization program which includes automated exposure control, adjustment of the mA and/or kV according to patient size and/or use of iterative reconstruction technique. CONTRAST:  100mL OMNIPAQUE  IOHEXOL  300 MG/ML  SOLN COMPARISON:  None Available. FINDINGS: Lower chest: There are patchy atelectatic changes in the visualized lung bases. No overt consolidation. No  pleural effusion. The heart is normal in size. No pericardial effusion. Hepatobiliary: The liver is normal in size. Non-cirrhotic configuration. No suspicious mass. There is a minimally exophytic 2.5 x 2.7 cm cyst arising from the right hepatic lobe. No intrahepatic bile duct dilation. There is mild prominence of the extrahepatic bile duct, most likely due to post cholecystectomy status. Gallbladder is surgically absent. Pancreas: Unremarkable. No pancreatic ductal dilatation or surrounding inflammatory changes. Spleen: Within normal limits. No focal lesion. Adrenals/Urinary Tract: Adrenal glands are unremarkable. No suspicious renal mass. Multiple sinus cysts noted in bilateral kidneys, left more than right. There are additional several subcentimeter sized cortical hypoattenuating structures throughout bilateral kidneys, which are too small to adequately characterize. No nephroureterolithiasis or obstructive uropathy on either side. Unremarkable urinary bladder. Stomach/Bowel: No disproportionate dilation of the small or large bowel loops. Unremarkable appendix. There is an approximately 6-7 cm long segment of colon at the junction of proximal sigmoid colon and descending colon exhibiting mild irregular circumferential wall thickening and moderate pericolonic fat stranding on the background of several diverticula, compatible with acute diverticulitis. There is an approximately 1.8 x 2.4 x 3.6 cm hypoattenuating area along the mesenteric border of the affected segment, which is favored to represent intramural abscess. No pneumoperitoneum, pneumatosis or portal venous gas. Vascular/Lymphatic: No ascites or pneumoperitoneum. No abdominal or pelvic lymphadenopathy, by size criteria. No aneurysmal dilation of the major abdominal arteries. Reproductive: Anteverted slightly bulky uterus noted, not well evaluated on this exam. No large adnexal mass seen. Other: There is a tiny fat containing umbilical hernia. The soft  tissues and abdominal wall are otherwise unremarkable. Musculoskeletal: No suspicious osseous lesions. There are mild - moderate multilevel degenerative changes in the visualized spine. IMPRESSION: 1. Findings compatible with acute diverticulitis involving a short segment of colon at the junction of proximal sigmoid colon and descending colon. There is an approximately 1.8 x 2.4 x 3.6 cm hypoattenuating area along the mesenteric border of the affected segment, which is favored to represent intramural abscess. No pneumoperitoneum, pneumatosis or portal venous gas. 2. Multiple other nonacute observations, as described above. Electronically Signed   By: Ree Molt M.D.   On: 10/18/2023 16:07    EKG: None available   Consultant: General  Surgery Dr. Curvin was notified from the ED  Code Status: Full code  Microbiology none  Antibiotics: Zosyn   Family Communication:  Patients' condition and plan of care including tests being ordered have been discussed with the patient  who indicate understanding and agree with the plan.   Status is: Observation The patient remains OBS appropriate and will d/c before 2 midnights.   Severity of Illness: The appropriate patient status for this patient is OBSERVATION. Observation status is judged to be reasonable and necessary in order to provide the required intensity of service to ensure the patient's safety. The patient's presenting symptoms, physical exam findings, and initial radiographic and laboratory data in the context of their medical condition is felt to place them at decreased risk for further clinical deterioration. Furthermore, it is anticipated that the patient will be medically stable for discharge from the hospital within 2 midnights of admission.   Signed, Vernal Alstrom, MD [facility] 10/18/2023

## 2023-10-18 NOTE — Progress Notes (Signed)
   10/18/23 2243  BiPAP/CPAP/SIPAP  $ Non-Invasive Home Ventilator  Initial  BiPAP/CPAP/SIPAP Pt Type Adult  BiPAP/CPAP/SIPAP Resmed  Mask Type Nasal mask  Dentures removed? Not applicable  Mask Size Small  Respiratory Rate 17 breaths/min  FiO2 (%) 21 %  Patient Home Machine No  Patient Home Mask No  Patient Home Tubing No  Auto Titrate Yes  Minimum cmH2O 8 cmH2O  Maximum cmH2O 20 cmH2O  Device Plugged into [employer] Power Outlet Yes

## 2023-10-18 NOTE — ED Provider Notes (Signed)
 Care was taken over from Dr. Gennaro.  Patient presented with some left lower quadrant abdominal pain.  CT scan shows evidence of acute diverticulitis with an intramural wall abscess.  Initially patient and her family member at bedside requested the patient go to [facility].  I contacted the transfer line there.  The transfer coordinator advises that there are no beds at [facility] and wants to know if they are okay with going to the main campus in [place].  Discussed with the family and they would rather be admitted to [facility].  Discussed with Dr. Tobie with the hospitalist service who is excepted the patient for admission to [facility].  Also discussed with Dr. Curvin with general surgery.  He will put the patient on their list to follow along in the morning.  Patient was given some IV Zosyn .   Lenor Hollering, MD 10/18/23 [phone]

## 2023-10-18 NOTE — ED Triage Notes (Signed)
 Pt c/o generalized abd pain all over; +nausea, thought she was constipated; is passing loose stool

## 2023-10-19 ENCOUNTER — Encounter (HOSPITAL_COMMUNITY): Payer: Self-pay | Admitting: Internal Medicine

## 2023-10-19 DIAGNOSIS — Z885 Allergy status to narcotic agent status: Secondary | ICD-10-CM | POA: Diagnosis not present

## 2023-10-19 DIAGNOSIS — R1084 Generalized abdominal pain: Secondary | ICD-10-CM

## 2023-10-19 DIAGNOSIS — I110 Hypertensive heart disease with heart failure: Secondary | ICD-10-CM | POA: Diagnosis present

## 2023-10-19 DIAGNOSIS — E039 Hypothyroidism, unspecified: Secondary | ICD-10-CM | POA: Diagnosis present

## 2023-10-19 DIAGNOSIS — Z79899 Other long term (current) drug therapy: Secondary | ICD-10-CM | POA: Diagnosis not present

## 2023-10-19 DIAGNOSIS — G4730 Sleep apnea, unspecified: Secondary | ICD-10-CM

## 2023-10-19 DIAGNOSIS — K5792 Diverticulitis of intestine, part unspecified, without perforation or abscess without bleeding: Secondary | ICD-10-CM | POA: Diagnosis present

## 2023-10-19 DIAGNOSIS — Z79890 Hormone replacement therapy (postmenopausal): Secondary | ICD-10-CM | POA: Diagnosis not present

## 2023-10-19 DIAGNOSIS — I10 Essential (primary) hypertension: Secondary | ICD-10-CM

## 2023-10-19 DIAGNOSIS — K5720 Diverticulitis of large intestine with perforation and abscess without bleeding: Secondary | ICD-10-CM | POA: Diagnosis present

## 2023-10-19 DIAGNOSIS — Z9049 Acquired absence of other specified parts of digestive tract: Secondary | ICD-10-CM | POA: Diagnosis not present

## 2023-10-19 DIAGNOSIS — E66813 Obesity, class 3: Secondary | ICD-10-CM

## 2023-10-19 DIAGNOSIS — Z6841 Body Mass Index (BMI) 40.0 and over, adult: Secondary | ICD-10-CM | POA: Diagnosis not present

## 2023-10-19 DIAGNOSIS — I509 Heart failure, unspecified: Secondary | ICD-10-CM | POA: Diagnosis present

## 2023-10-19 LAB — COMPREHENSIVE METABOLIC PANEL WITH GFR
ALT: 14 U/L (ref 0–44)
AST: 15 U/L (ref 15–41)
Albumin: 2.9 g/dL — ABNORMAL LOW (ref 3.5–5.0)
Alkaline Phosphatase: 51 U/L (ref 38–126)
Anion gap: 8 (ref 5–15)
BUN: 8 mg/dL (ref 8–23)
CO2: 24 mmol/L (ref 22–32)
Calcium: 8.4 mg/dL — ABNORMAL LOW (ref 8.9–10.3)
Chloride: 108 mmol/L (ref 98–111)
Creatinine, Ser: 0.85 mg/dL (ref 0.44–1.00)
GFR, Estimated: >60 mL/min (ref >60)
Glucose, Bld: 87 mg/dL (ref 70–99)
Potassium: 4 mmol/L (ref 3.5–5.1)
Sodium: 140 mmol/L (ref 135–145)
Total Bilirubin: 1.1 mg/dL (ref 0.0–1.2)
Total Protein: 6.6 g/dL (ref 6.5–8.1)

## 2023-10-19 LAB — CBC
HCT: 36.6 % (ref 36.0–46.0)
Hemoglobin: 11.4 g/dL — ABNORMAL LOW (ref 12.0–15.0)
MCH: 30.8 pg (ref 26.0–34.0)
MCHC: 31.1 g/dL (ref 30.0–36.0)
MCV: 98.9 fL (ref 80.0–100.0)
Platelets: 239 K/uL (ref 150–400)
RBC: 3.7 MIL/uL — ABNORMAL LOW (ref 3.87–5.11)
RDW: 14.5 % (ref 11.5–15.5)
WBC: 8.6 K/uL (ref 4.0–10.5)
nRBC: 0 % (ref 0.0–0.2)

## 2023-10-19 LAB — MAGNESIUM: Magnesium: 2.4 mg/dL (ref 1.7–2.4)

## 2023-10-19 LAB — PHOSPHORUS: Phosphorus: 3.1 mg/dL (ref 2.5–4.6)

## 2023-10-19 MED ORDER — ENOXAPARIN SODIUM 80 MG/0.8ML IJ SOSY
70.0000 mg | PREFILLED_SYRINGE | INTRAMUSCULAR | Status: DC
  Administered 2023-10-19: 70 mg via SUBCUTANEOUS
  Filled 2023-10-19: qty 0.8

## 2023-10-19 NOTE — Plan of Care (Signed)
?  Problem: Clinical Measurements: ?Goal: Will remain free from infection ?Outcome: Progressing ?Goal: Diagnostic test results will improve ?Outcome: Progressing ?Goal: Respiratory complications will improve ?Outcome: Progressing ?  ?

## 2023-10-19 NOTE — Progress Notes (Signed)
 [facility]                                                                               Teresa Blackburn, is a 73 y.o. female, DOB - March 24, 1951, FMW:[id number] Admit date - 10/18/2023    Outpatient Primary MD for the patient is Claudene Round, MD  LOS - 0  days    Brief summary   Patient is a 73 years old female with past medical history of hypertension, sleep apnea, hypothyroidism and morbid obesity presented to the hospital with complaints of abdominal pain mostly over the left lower quadrant since Friday.  It was associated with nausea but no vomiting    Assessment & Plan    Assessment and Plan:   Acute diverticulitis with intra mural abscess.  General Surgery consulted recommended conservative management.  Continue with IV antibiotics, n.p.o.  Symptomatic management with IV antiemetics pain control and IV fluids    Hypertension Blood pressure parameters appear to be optimal   Class III obesity Lifestyle modification recommended    History of sleep apnea on CPAP continue the same       Estimated body mass index is 56.45 kg/m as calculated from the following:   Height as of this encounter: 5' 2 (1.575 m).   Weight as of this encounter: 140 kg.  Code Status:full code. DVT Prophylaxis:  SCDs Start: 10/18/23 2027   Level of Care: Level of care: Med-Surg Family Communication:  family at bedside.   Disposition Plan:     Remains inpatient appropriate:  pending.   Procedures:  None.   Consultants:   Gen surgery.   Antimicrobials:   Anti-infectives (From admission, onward)    Start     Dose/Rate Route Frequency Ordered Stop   10/18/23 2200  piperacillin -tazobactam (ZOSYN ) IVPB 3.375 g        3.375 g 12.5 mL/hr over 240 Minutes Intravenous Every 8 hours 10/18/23 2113     10/18/23 1630  piperacillin -tazobactam (ZOSYN ) IVPB 3.375 g        3.375 g 100 mL/hr over 30 Minutes Intravenous  Once 10/18/23 1617 10/18/23 1931         Medications  Scheduled Meds:  carvedilol   3.125 mg Oral BID WC   enoxaparin  (LOVENOX ) injection  70 mg Subcutaneous Q24H   levothyroxine   150 mcg Oral Q0600   losartan   25 mg Oral Daily   sodium chloride  flush  3 mL Intravenous Q12H   Continuous Infusions:  sodium chloride  100 mL/hr at 10/19/23 1100   sodium chloride      piperacillin -tazobactam (ZOSYN )  IV 3.375 g (10/19/23 0542)   PRN Meds:.sodium chloride , acetaminophen  **OR** acetaminophen , albuterol , HYDROcodone -acetaminophen , HYDROmorphone  (DILAUDID ) injection, ondansetron  **OR** ondansetron  (ZOFRAN ) IV, sodium chloride  flush    Subjective:   Teresa Blackburn was seen and examined today.  Abd pain is improving.   Objective:   Vitals:   10/18/23 1626 10/18/23 2016 10/19/23 0138 10/19/23 0517  BP: 103/72 128/80 136/82 (!) 144/80  Pulse: 76 82 72 72  Resp: 18 18 18 18   Temp: 98.2 F (36.8 C) 98.5 F (36.9 C) 98.2 F (36.8 C) 98.4 F (36.9 C)  TempSrc: Oral Oral Oral Oral  SpO2: 97% 100% 100% 99%  Weight:      Height:        Intake/Output Summary (Last 24 hours) at 10/19/2023 1312 Last data filed at 10/19/2023 0900 Gross per 24 hour  Intake 409.35 ml  Output 400 ml  Net 9.35 ml   Filed Weights   10/18/23 1318  Weight: (!) 140 kg     Exam General exam: Appears calm and comfortable  Respiratory system: Clear to auscultation. Respiratory effort normal. Cardiovascular system: S1 & S2 heard, RRR. No JVD,  Gastrointestinal system: Abdomen is nondistended, soft and nontender.  Central nervous system: Alert and oriented.  Extremities: Symmetric 5 x 5 power. Skin: No rashes,  Psychiatry:  Mood & affect appropriate.     Data Reviewed:  I have personally reviewed following labs and imaging studies   CBC Lab Results  Component Value Date   WBC 8.6 10/19/2023   RBC 3.70 (L) 10/19/2023   HGB 11.4 (L) 10/19/2023   HCT 36.6 10/19/2023   MCV 98.9 10/19/2023   MCH 30.8 10/19/2023   PLT 239 10/19/2023    MCHC 31.1 10/19/2023   RDW 14.5 10/19/2023   LYMPHSABS 1.7 10/18/2023   MONOABS 1.1 (H) 10/18/2023   EOSABS 0.3 10/18/2023   BASOSABS 0.0 10/18/2023     Last metabolic panel Lab Results  Component Value Date   NA 140 10/19/2023   K 4.0 10/19/2023   CL 108 10/19/2023   CO2 24 10/19/2023   BUN 8 10/19/2023   CREATININE 0.85 10/19/2023   GLUCOSE 87 10/19/2023   GFRNONAA >60 10/19/2023   GFRAA 72 (L) 07/11/2013   CALCIUM 8.4 (L) 10/19/2023   PHOS 3.1 10/19/2023   PROT 6.6 10/19/2023   ALBUMIN 2.9 (L) 10/19/2023   BILITOT 1.1 10/19/2023   ALKPHOS 51 10/19/2023   AST 15 10/19/2023   ALT 14 10/19/2023   ANIONGAP 8 10/19/2023    CBG (last 3)  No results for input(s): GLUCAP in the last 72 hours.    Coagulation Profile: No results for input(s): INR, PROTIME in the last 168 hours.   Radiology Studies: CT ABDOMEN PELVIS W CONTRAST Result Date: 10/18/2023 CLINICAL DATA:  abd pain, llq worst, generalized.  Nausea. EXAM: CT ABDOMEN AND PELVIS WITH CONTRAST TECHNIQUE: Multidetector CT imaging of the abdomen and pelvis was performed using the standard protocol following bolus administration of intravenous contrast. RADIATION DOSE REDUCTION: This exam was performed according to the departmental dose-optimization program which includes automated exposure control, adjustment of the mA and/or kV according to patient size and/or use of iterative reconstruction technique. CONTRAST:  OMNIPAQUE  IOHEXOL  300 MG/ML  SOLN COMPARISON:  None Available. FINDINGS: Lower chest: There are patchy atelectatic changes in the visualized lung bases. No overt consolidation. No pleural effusion. The heart is normal in size. No pericardial effusion. Hepatobiliary: The liver is normal in size. Non-cirrhotic configuration. No suspicious mass. There is a minimally exophytic 2.5 x 2.7 cm cyst arising from the right hepatic lobe. No intrahepatic bile duct dilation. There is mild prominence of the extrahepatic  bile duct, most likely due to post cholecystectomy status. Gallbladder is surgically absent. Pancreas: Unremarkable. No pancreatic ductal dilatation or surrounding inflammatory changes. Spleen: Within normal limits. No focal lesion. Adrenals/Urinary Tract: Adrenal glands are unremarkable. No suspicious renal mass. Multiple sinus cysts noted in bilateral kidneys, left more than right. There are additional several subcentimeter sized cortical hypoattenuating structures throughout bilateral kidneys, which are too small to adequately characterize. No nephroureterolithiasis or obstructive uropathy on  either side. Unremarkable urinary bladder. Stomach/Bowel: No disproportionate dilation of the small or large bowel loops. Unremarkable appendix. There is an approximately 6-7 cm long segment of colon at the junction of proximal sigmoid colon and descending colon exhibiting mild irregular circumferential wall thickening and moderate pericolonic fat stranding on the background of several diverticula, compatible with acute diverticulitis. There is an approximately 1.8 x 2.4 x 3.6 cm hypoattenuating area along the mesenteric border of the affected segment, which is favored to represent intramural abscess. No pneumoperitoneum, pneumatosis or portal venous gas. Vascular/Lymphatic: No ascites or pneumoperitoneum. No abdominal or pelvic lymphadenopathy, by size criteria. No aneurysmal dilation of the major abdominal arteries. Reproductive: Anteverted slightly bulky uterus noted, not well evaluated on this exam. No large adnexal mass seen. Other: There is a tiny fat containing umbilical hernia. The soft tissues and abdominal wall are otherwise unremarkable. Musculoskeletal: No suspicious osseous lesions. There are mild - moderate multilevel degenerative changes in the visualized spine. IMPRESSION: 1. Findings compatible with acute diverticulitis involving a short segment of colon at the junction of proximal sigmoid colon and  descending colon. There is an approximately 1.8 x 2.4 x 3.6 cm hypoattenuating area along the mesenteric border of the affected segment, which is favored to represent intramural abscess. No pneumoperitoneum, pneumatosis or portal venous gas. 2. Multiple other nonacute observations, as described above. Electronically Signed   By: Ree Molt M.D.   On: 10/18/2023 16:07       Elgie Butter M.D. [facility] 10/19/2023, 1:12 PM  Available via Epic secure chat 7am-7pm After 7 pm, please refer to night coverage provider listed on amion.

## 2023-10-19 NOTE — Progress Notes (Signed)
   10/19/23 2235  BiPAP/CPAP/SIPAP  Heater Temperature  (Humidifier add and filled to full line with sterile water)

## 2023-10-19 NOTE — Progress Notes (Signed)
   10/19/23 2059  BiPAP/CPAP/SIPAP  BiPAP/CPAP/SIPAP Pt Type Adult (prefers self placement)  BiPAP/CPAP/SIPAP Resmed  Mask Type Nasal mask  Dentures removed? Not applicable  FiO2 (%) 21 %  Patient Home Machine No  Patient Home Mask No  Patient Home Tubing No  Auto Titrate Yes  Minimum cmH2O 8 cmH2O  Maximum cmH2O 20 cmH2O  CPAP/SIPAP surface wiped down Yes  Device Plugged into RED Power Outlet Yes  BiPAP/CPAP /SiPAP Vitals  Pulse Rate 87  Resp 18  SpO2 98 %  Bilateral Breath Sounds Clear;Diminished  MEWS Score/Color  MEWS Score 0  MEWS Score Color Landy

## 2023-10-19 NOTE — Progress Notes (Signed)
 Mobility Specialist - Progress Note   10/19/23 1111  Mobility  Activity Ambulated with assistance in hallway  Level of Assistance Modified independent, requires aide device or extra time  Assistive Device Front wheel walker  Distance Ambulated (ft) 500 ft  Activity Response Tolerated well  Mobility Referral Yes  Mobility visit 1 Mobility  Mobility Specialist Start Time (ACUTE ONLY) 1059  Mobility Specialist Stop Time (ACUTE ONLY) 1111  Mobility Specialist Time Calculation (min) (ACUTE ONLY) 12 min   Pt received in bed and agreeable to mobility. No complaints during session. Pt to recliner after session with all needs met.    [facility]

## 2023-10-19 NOTE — Consult Note (Signed)
 Teresa Blackburn 05/01/1950  [id number].    Requesting MD: Dr. 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  She admits to anorexia, but no nausea or vomiting.  She had some constipation and took Miralax.  She denies any blood in her stool.  She last had a colonoscopy about 5 years ago with some polyps but a recall date of 10 yrs per the patient.  She has never had diverticulitis before.  She denies any fevers, dysuria, CP, SOB.  She presented to the [facility] ED yesterday due to persistent pain.  She has a WBC of 10.9, which is normal today and a CT scan showing diverticulitis with an intramural abscess.  She has been admitted and started on IV zosyn .  She is feeling better today with minimal pain.  We have been asked to see her.  ROS: ROS: see HPI  History reviewed. No pertinent family history.  Past Medical History:  Diagnosis Date   Bronchitis    CHF (congestive heart failure) (HCC)    Hypertension    Morbid obesity (HCC)     Past Surgical History:  Procedure Laterality Date   CESAREAN SECTION     CHOLECYSTECTOMY      Social History:  reports that she has never smoked. She does not have any smokeless tobacco history on file. She reports that she does not drink alcohol  and does not use drugs.  Allergies:  Allergies  Allergen Reactions   Codeine Shortness Of Breath, Anxiety, Palpitations and Other (See Comments)    Gives a overwhelming feeling    Medications Prior to Admission  Medication Sig Dispense Refill   albuterol  (VENTOLIN  HFA) 108 (90 Base) MCG/ACT inhaler Inhale 1-2 puffs into the lungs every 6 (six) hours as needed for wheezing or shortness of breath. 1 each 0   carvedilol  (COREG ) 3.125 MG tablet Take 1 tablet (3.125  mg total) by mouth 2 (two) times daily with a meal. 60 tablet 0   estradiol (ESTRACE) 0.1 MG/GM vaginal cream Place 1 g vaginally 2 (two) times a week. Wednesdays and Fridays     furosemide  (LASIX ) 40 MG tablet Take 40 mg by mouth daily.     levothyroxine  (SYNTHROID ) 150 MCG tablet Take 150 mcg by mouth daily.     losartan  (COZAAR ) 100 MG tablet Take 100 mg by mouth daily.     potassium chloride (KLOR-CON) 10 MEQ tablet Take 20 mEq by mouth daily.     ciprofloxacin  (CIPRO ) 500 MG tablet Take 1 tablet (500 mg total) by mouth 2 (two) times daily. (Patient not taking: Reported on 10/19/2023) 6 tablet 0   meloxicam (MOBIC) 15 MG tablet Take 15 mg by mouth. (Patient not taking: Reported on 10/19/2023)     Vitamin D, Ergocalciferol, (DRISDOL) 1.25 MG (50000 UNIT) CAPS capsule Take 50,000 Units by mouth once a week. (Patient not taking: Reported on 10/19/2023)       Physical Exam: Blood pressure (!) 144/80, pulse 72, temperature 98.4 F (36.9 C), temperature source Oral, resp. rate 18, height 5' 2 (1.575 m), weight (!) 140 kg, SpO2 99%. General: pleasant, morbidly obese black female who is laying in bed in NAD HEENT: head is normocephalic, atraumatic.  Sclera are noninjected.  PERRL.  Ears and nose  without any masses or lesions.  Mouth is pink and moist Heart: regular, rate, and rhythm.  Normal s1,s2. No obvious murmurs, gallops, or rubs noted.   Lungs: CTAB, no wheezes, rhonchi, or rales noted.  Respiratory effort nonlabored Abd: soft, minimal tender in LLQ to deep palpation, ND, +BS, no masses, hernias, or organomegaly, but body habitus limits this significantly. Psych: A&Ox3 with an appropriate affect.   Results for orders placed or performed during the hospital encounter of 10/18/23 (from the past 48 hours)  Lipase, blood     Status: None   Collection Time: 10/18/23  2:25 PM  Result Value Ref Range   Lipase 21 11 - 51 U/L    Comment: Performed at [facility], [address]  [place], [place] [address]  Basic metabolic panel     Status: Abnormal   Collection Time: 10/18/23  2:25 PM  Result Value Ref Range   Sodium 139 135 - 145 mmol/L   Potassium 4.3 3.5 - 5.1 mmol/L   Chloride 104 98 - 111 mmol/L   CO2 24 22 - 32 mmol/L   Glucose, Bld 87 70 - 99 mg/dL    Comment: Glucose reference range applies only to samples taken after fasting for at least 8 hours.   BUN 7 (L) 8 - 23 mg/dL   Creatinine, Ser 9.31 0.44 - 1.00 mg/dL   Calcium 8.9 8.9 - 89.6 mg/dL   GFR, Estimated >39 >39 mL/min    Comment: (NOTE) Calculated using the CKD-EPI Creatinine Equation (2021)    Anion gap 11 5 - 15    Comment: Performed at [facility], [address] [place], [place] [address]  CBC with Differential     Status: Abnormal   Collection Time: 10/18/23  2:25 PM  Result Value Ref Range   WBC 10.9 (H) 4.0 - 10.5 K/uL   RBC 4.18 3.87 - 5.11 MIL/uL   Hemoglobin 12.9 12.0 - 15.0 g/dL   HCT 60.5 63.9 - 53.9 %   MCV 94.3 80.0 - 100.0 fL   MCH 30.9 26.0 - 34.0 pg   MCHC 32.7 30.0 - 36.0 g/dL   RDW 85.7 88.4 - 84.4 %   Platelets 265 150 - 400 K/uL   nRBC 0.0 0.0 - 0.2 %   Neutrophils Relative % 73 %   Neutro Abs 7.7 1.7 - 7.7 K/uL   Lymphocytes Relative 15 %   Lymphs Abs 1.7 0.7 - 4.0 K/uL   Monocytes Relative 10 %   Monocytes Absolute 1.1 (H) 0.1 - 1.0 K/uL   Eosinophils Relative 2 %   Eosinophils Absolute 0.3 0.0 - 0.5 K/uL   Basophils Relative 0 %   Basophils Absolute 0.0 0.0 - 0.1 K/uL   Immature Granulocytes 0 %   Abs Immature Granulocytes 0.04 0.00 - 0.07 K/uL    Comment: Performed at [facility], [address] [place], [place] [address]  Hepatic function panel     Status: Abnormal   Collection Time: 10/18/23  2:25 PM  Result Value Ref Range   Total Protein 7.4 6.5 - 8.1 g/dL   Albumin 3.8 3.5 - 5.0 g/dL   AST 20 15 - 41 U/L   ALT 15 0 - 44 U/L   Alkaline Phosphatase 68 38 - 126 U/L   Total Bilirubin 0.8 0.0 - 1.2 mg/dL   Bilirubin, Direct 0.3  (H) 0.0 - 0.2 mg/dL   Indirect Bilirubin 0.5 0.3 - 0.9 mg/dL  Comment: Performed at [facility], [address] [place], [place] [address]  Urinalysis, w/ Reflex to Culture (Infection Suspected) -Urine, Clean Catch     Status: Abnormal   Collection Time: 10/18/23  4:26 PM  Result Value Ref Range   Specimen Source URINE, CLEAN CATCH    Color, Urine YELLOW YELLOW   APPearance CLEAR CLEAR   Specific Gravity, Urine 1.010 1.005 - 1.030   pH 6.0 5.0 - 8.0   Glucose, UA NEGATIVE NEGATIVE mg/dL   Hgb urine dipstick MODERATE (A) NEGATIVE   Bilirubin Urine NEGATIVE NEGATIVE   Ketones, ur NEGATIVE NEGATIVE mg/dL   Protein, ur NEGATIVE NEGATIVE mg/dL   Nitrite NEGATIVE NEGATIVE   Leukocytes,Ua TRACE (A) NEGATIVE   Squamous Epithelial / HPF 0-5 0 - 5 /HPF   WBC, UA 6-10 0 - 5 WBC/hpf    Comment: Reflex urine culture not performed if WBC <=10, OR if Squamous epithelial cells >5. If Squamous epithelial cells >5, suggest recollection.   RBC / HPF 6-10 0 - 5 RBC/hpf   Bacteria, UA MANY (A) NONE SEEN    Comment: Performed at [facility], [address] [place], [place] [address]  Comprehensive metabolic panel     Status: Abnormal   Collection Time: 10/19/23  4:51 AM  Result Value Ref Range   Sodium 140 135 - 145 mmol/L   Potassium 4.0 3.5 - 5.1 mmol/L   Chloride 108 98 - 111 mmol/L   CO2 24 22 - 32 mmol/L   Glucose, Bld 87 70 - 99 mg/dL    Comment: Glucose reference range applies only to samples taken after fasting for at least 8 hours.   BUN 8 8 - 23 mg/dL   Creatinine, Ser 9.14 0.44 - 1.00 mg/dL   Calcium 8.4 (L) 8.9 - 10.3 mg/dL   Total Protein 6.6 6.5 - 8.1 g/dL   Albumin 2.9 (L) 3.5 - 5.0 g/dL   AST 15 15 - 41 U/L   ALT 14 0 - 44 U/L   Alkaline Phosphatase 51 38 - 126 U/L   Total Bilirubin 1.1 0.0 - 1.2 mg/dL   GFR, Estimated >39 >39 mL/min    Comment: (NOTE) Calculated using the CKD-EPI Creatinine Equation (2021)    Anion gap 8 5 - 15    Comment:  Performed at [facility], [address] [place], [place] [address]  CBC     Status: Abnormal   Collection Time: 10/19/23  4:51 AM  Result Value Ref Range   WBC 8.6 4.0 - 10.5 K/uL   RBC 3.70 (L) 3.87 - 5.11 MIL/uL   Hemoglobin 11.4 (L) 12.0 - 15.0 g/dL   HCT 63.3 63.9 - 53.9 %   MCV 98.9 80.0 - 100.0 fL   MCH 30.8 26.0 - 34.0 pg   MCHC 31.1 30.0 - 36.0 g/dL   RDW 85.4 88.4 - 84.4 %   Platelets 239 150 - 400 K/uL   nRBC 0.0 0.0 - 0.2 %    Comment: Performed at [facility], [address], [place], [place] [address]  Magnesium     Status: None   Collection Time: 10/19/23  4:51 AM  Result Value Ref Range   Magnesium 2.4 1.7 - 2.4 mg/dL    Comment: Performed at [facility], [address], [place], [place] [address]  Phosphorus     Status: None   Collection Time: 10/19/23  4:51 AM  Result Value Ref  Range   Phosphorus 3.1 2.5 - 4.6 mg/dL    Comment: Performed at [facility], [address] [place], [place] [address]   CT ABDOMEN PELVIS W CONTRAST Result Date: 10/18/2023 CLINICAL DATA:  abd pain, llq worst, generalized.  Nausea. EXAM: CT ABDOMEN AND PELVIS WITH CONTRAST TECHNIQUE: Multidetector CT imaging of the abdomen and pelvis was performed using the standard protocol following bolus administration of intravenous contrast. RADIATION DOSE REDUCTION: This exam was performed according to the departmental dose-optimization program which includes automated exposure control, adjustment of the mA and/or kV according to patient size and/or use of iterative reconstruction technique. CONTRAST:  100mL OMNIPAQUE  IOHEXOL  300 MG/ML  SOLN COMPARISON:  None Available. FINDINGS: Lower chest: There are patchy atelectatic changes in the visualized lung bases. No overt consolidation. No pleural effusion. The heart is normal in size. No pericardial effusion. Hepatobiliary: The liver is normal in size. Non-cirrhotic configuration.  No suspicious mass. There is a minimally exophytic 2.5 x 2.7 cm cyst arising from the right hepatic lobe. No intrahepatic bile duct dilation. There is mild prominence of the extrahepatic bile duct, most likely due to post cholecystectomy status. Gallbladder is surgically absent. Pancreas: Unremarkable. No pancreatic ductal dilatation or surrounding inflammatory changes. Spleen: Within normal limits. No focal lesion. Adrenals/Urinary Tract: Adrenal glands are unremarkable. No suspicious renal mass. Multiple sinus cysts noted in bilateral kidneys, left more than right. There are additional several subcentimeter sized cortical hypoattenuating structures throughout bilateral kidneys, which are too small to adequately characterize. No nephroureterolithiasis or obstructive uropathy on either side. Unremarkable urinary bladder. Stomach/Bowel: No disproportionate dilation of the small or large bowel loops. Unremarkable appendix. There is an approximately 6-7 cm long segment of colon at the junction of proximal sigmoid colon and descending colon exhibiting mild irregular circumferential wall thickening and moderate pericolonic fat stranding on the background of several diverticula, compatible with acute diverticulitis. There is an approximately 1.8 x 2.4 x 3.6 cm hypoattenuating area along the mesenteric border of the affected segment, which is favored to represent intramural abscess. No pneumoperitoneum, pneumatosis or portal venous gas. Vascular/Lymphatic: No ascites or pneumoperitoneum. No abdominal or pelvic lymphadenopathy, by size criteria. No aneurysmal dilation of the major abdominal arteries. Reproductive: Anteverted slightly bulky uterus noted, not well evaluated on this exam. No large adnexal mass seen. Other: There is a tiny fat containing umbilical hernia. The soft tissues and abdominal wall are otherwise unremarkable. Musculoskeletal: No suspicious osseous lesions. There are mild - moderate multilevel  degenerative changes in the visualized spine. IMPRESSION: 1. Findings compatible with acute diverticulitis involving a short segment of colon at the junction of proximal sigmoid colon and descending colon. There is an approximately 1.8 x 2.4 x 3.6 cm hypoattenuating area along the mesenteric border of the affected segment, which is favored to represent intramural abscess. No pneumoperitoneum, pneumatosis or portal venous gas. 2. Multiple other nonacute observations, as described above. Electronically Signed   By: Ree Molt M.D.   On: 10/18/2023 16:07      Assessment/Plan Diverticulitis with intramural abscess The patient has been seen, examined, labs, vitals, chart, and imaging personally reviewed.  She has evidence of diverticulitis near her sigmoid colon with a small intramural abscess noted.  She is already feeling improved on IV abx since last night.  This abscess is noted to likely be intramural.  No recommendations for draining this as it would cause a perforation of the colon in doing so.  Given her WBC has normalized and pain already improved,  we recommend continuing conservative management for now.  She may have a CLD today.  We did discuss the need for repeat imaging or Hartmann's on the chance she failed to improve.  She understands.  We will continue to follow.   FEN - CLD VTE - Lovenox  ID - Zosyn   HTN CHF Morbid obesity hypothyroidism  I reviewed hospitalist notes, last 24 h vitals and pain scores, last 48 h intake and output, last 24 h labs and trends, and last 24 h imaging results.  Burnard FORBES Banter, [facility] 10/19/2023, 12:08 PM Please see Amion for pager number during day hours 7:00am-4:30pm or 7:00am -11:30am on weekends

## 2023-10-19 NOTE — Progress Notes (Signed)
   10/19/23 1434  TOC Brief Assessment  Insurance and Status Reviewed  Patient has primary care physician Yes  Home environment has been reviewed home with family  Prior level of function: independent  Prior/Current Home Services No current home services  Social Drivers of Health Review SDOH reviewed no interventions necessary  Readmission risk has been reviewed Yes  Transition of care needs no transition of care needs at this time

## 2023-10-20 DIAGNOSIS — E66813 Obesity, class 3: Secondary | ICD-10-CM | POA: Diagnosis not present

## 2023-10-20 DIAGNOSIS — K5720 Diverticulitis of large intestine with perforation and abscess without bleeding: Secondary | ICD-10-CM | POA: Diagnosis not present

## 2023-10-20 DIAGNOSIS — G4730 Sleep apnea, unspecified: Secondary | ICD-10-CM | POA: Diagnosis not present

## 2023-10-20 DIAGNOSIS — I10 Essential (primary) hypertension: Secondary | ICD-10-CM | POA: Diagnosis not present

## 2023-10-20 LAB — CBC WITH DIFFERENTIAL/PLATELET
Abs Immature Granulocytes: 0.03 K/uL (ref 0.00–0.07)
Basophils Absolute: 0.1 K/uL (ref 0.0–0.1)
Basophils Relative: 1 %
Eosinophils Absolute: 0.3 K/uL (ref 0.0–0.5)
Eosinophils Relative: 5 %
HCT: 36.5 % (ref 36.0–46.0)
Hemoglobin: 11.3 g/dL — ABNORMAL LOW (ref 12.0–15.0)
Immature Granulocytes: 1 %
Lymphocytes Relative: 29 %
Lymphs Abs: 1.6 K/uL (ref 0.7–4.0)
MCH: 30.6 pg (ref 26.0–34.0)
MCHC: 31 g/dL (ref 30.0–36.0)
MCV: 98.9 fL (ref 80.0–100.0)
Monocytes Absolute: 0.6 K/uL (ref 0.1–1.0)
Monocytes Relative: 11 %
Neutro Abs: 2.9 K/uL (ref 1.7–7.7)
Neutrophils Relative %: 53 %
Platelets: 245 K/uL (ref 150–400)
RBC: 3.69 MIL/uL — ABNORMAL LOW (ref 3.87–5.11)
RDW: 14.2 % (ref 11.5–15.5)
WBC: 5.4 K/uL (ref 4.0–10.5)
nRBC: 0 % (ref 0.0–0.2)

## 2023-10-20 LAB — BASIC METABOLIC PANEL WITH GFR
Anion gap: 7 (ref 5–15)
BUN: 8 mg/dL (ref 8–23)
CO2: 24 mmol/L (ref 22–32)
Calcium: 8.2 mg/dL — ABNORMAL LOW (ref 8.9–10.3)
Chloride: 105 mmol/L (ref 98–111)
Creatinine, Ser: 0.76 mg/dL (ref 0.44–1.00)
GFR, Estimated: >60 mL/min (ref >60)
Glucose, Bld: 82 mg/dL (ref 70–99)
Potassium: 3.7 mmol/L (ref 3.5–5.1)
Sodium: 136 mmol/L (ref 135–145)

## 2023-10-20 MED ORDER — AMOXICILLIN-POT CLAVULANATE 875-125 MG PO TABS: 1.0000 | ORAL_TABLET | Freq: Two times a day (BID) | ORAL | 0 refills | Status: AC

## 2023-10-20 NOTE — Progress Notes (Signed)
 Mobility Specialist - Progress Note   10/20/23 1033  Mobility  Activity Ambulated independently in hallway  Level of Assistance Independent  Assistive Device None  Distance Ambulated (ft) 500 ft  Activity Response Tolerated well  Mobility Referral Yes  Mobility visit 1 Mobility  Mobility Specialist Start Time (ACUTE ONLY) 1023  Mobility Specialist Stop Time (ACUTE ONLY) 1033  Mobility Specialist Time Calculation (min) (ACUTE ONLY) 10 min   Pt received in recliner and agreeable to mobility. No complaints during session. Pt to recliner after session with all needs met.     [facility]

## 2023-10-20 NOTE — Plan of Care (Signed)

## 2023-10-20 NOTE — Discharge Instructions (Signed)
 Low fiber diet x 4 weeks, then switch to a high fiber diet

## 2023-10-20 NOTE — Progress Notes (Signed)
Pt was discharged home today. Instructions were reviewed with patient, and questions were answered. Pt was taken to main entrance via wheelchair by NT.  

## 2023-10-20 NOTE — Progress Notes (Signed)
 Subjective: Patient feels well today.  No pain.  Tolerating CLD with no issues.  ROS: See above, otherwise other systems negative  Objective: Vital signs in last 24 hours: Temp:  [97.9 F (36.6 C)-98.3 F (36.8 C)] 98.1 F (36.7 C) (07/18 0547) Pulse Rate:  [69-87] 69 (07/18 0547) Resp:  [16-18] 17 (07/18 0547) BP: (101-140)/(65-72) 107/65 (07/18 0547) SpO2:  [98 %-100 %] 100 % (07/18 0547) FiO2 (%):  [21 %] 21 % (07/17 2059) Weight:  [137.8 kg] 137.8 kg (07/18 0547) Last BM Date : 10/18/23  Intake/Output from previous day: 07/17 0701 - 07/18 0700 In: 2489.3 [P.O.:1750; I.V.:608.9; IV Piggyback:130.4] Out: 200 [Urine:200] Intake/Output this shift: No intake/output data recorded.  PE: Abd: soft, obese, +BS, ND, NT  Lab Results:  Recent Labs    10/18/23 1425 10/19/23 0451  WBC 10.9* 8.6  HGB 12.9 11.4*  HCT 39.4 36.6  PLT 265 239   BMET Recent Labs    10/18/23 1425 10/19/23 0451  NA 139 140  K 4.3 4.0  CL 104 108  CO2 24 24  GLUCOSE 87 87  BUN 7* 8  CREATININE 0.68 0.85  CALCIUM 8.9 8.4*   PT/INR No results for input(s): LABPROT, INR in the last 72 hours. CMP     Component Value Date/Time   NA 140 10/19/2023 0451   K 4.0 10/19/2023 0451   CL 108 10/19/2023 0451   CO2 24 10/19/2023 0451   GLUCOSE 87 10/19/2023 0451   BUN 8 10/19/2023 0451   CREATININE 0.85 10/19/2023 0451   CALCIUM 8.4 (L) 10/19/2023 0451   PROT 6.6 10/19/2023 0451   ALBUMIN 2.9 (L) 10/19/2023 0451   AST 15 10/19/2023 0451   ALT 14 10/19/2023 0451   ALKPHOS 51 10/19/2023 0451   BILITOT 1.1 10/19/2023 0451   GFRNONAA >60 10/19/2023 0451   GFRAA 72 (L) 07/11/2013 0559   Lipase     Component Value Date/Time   LIPASE 21 10/18/2023 1425       Studies/Results: CT ABDOMEN PELVIS W CONTRAST Result Date: 10/18/2023 CLINICAL DATA:  abd pain, llq worst, generalized.  Nausea. EXAM: CT ABDOMEN AND PELVIS WITH CONTRAST TECHNIQUE: Multidetector CT imaging of the  abdomen and pelvis was performed using the standard protocol following bolus administration of intravenous contrast. RADIATION DOSE REDUCTION: This exam was performed according to the departmental dose-optimization program which includes automated exposure control, adjustment of the mA and/or kV according to patient size and/or use of iterative reconstruction technique. CONTRAST:  100mL OMNIPAQUE  IOHEXOL  300 MG/ML  SOLN COMPARISON:  None Available. FINDINGS: Lower chest: There are patchy atelectatic changes in the visualized lung bases. No overt consolidation. No pleural effusion. The heart is normal in size. No pericardial effusion. Hepatobiliary: The liver is normal in size. Non-cirrhotic configuration. No suspicious mass. There is a minimally exophytic 2.5 x 2.7 cm cyst arising from the right hepatic lobe. No intrahepatic bile duct dilation. There is mild prominence of the extrahepatic bile duct, most likely due to post cholecystectomy status. Gallbladder is surgically absent. Pancreas: Unremarkable. No pancreatic ductal dilatation or surrounding inflammatory changes. Spleen: Within normal limits. No focal lesion. Adrenals/Urinary Tract: Adrenal glands are unremarkable. No suspicious renal mass. Multiple sinus cysts noted in bilateral kidneys, left more than right. There are additional several subcentimeter sized cortical hypoattenuating structures throughout bilateral kidneys, which are too small to adequately characterize. No nephroureterolithiasis or obstructive uropathy on either side. Unremarkable urinary bladder. Stomach/Bowel: No disproportionate dilation of the small  or large bowel loops. Unremarkable appendix. There is an approximately 6-7 cm long segment of colon at the junction of proximal sigmoid colon and descending colon exhibiting mild irregular circumferential wall thickening and moderate pericolonic fat stranding on the background of several diverticula, compatible with acute diverticulitis.  There is an approximately 1.8 x 2.4 x 3.6 cm hypoattenuating area along the mesenteric border of the affected segment, which is favored to represent intramural abscess. No pneumoperitoneum, pneumatosis or portal venous gas. Vascular/Lymphatic: No ascites or pneumoperitoneum. No abdominal or pelvic lymphadenopathy, by size criteria. No aneurysmal dilation of the major abdominal arteries. Reproductive: Anteverted slightly bulky uterus noted, not well evaluated on this exam. No large adnexal mass seen. Other: There is a tiny fat containing umbilical hernia. The soft tissues and abdominal wall are otherwise unremarkable. Musculoskeletal: No suspicious osseous lesions. There are mild - moderate multilevel degenerative changes in the visualized spine. IMPRESSION: 1. Findings compatible with acute diverticulitis involving a short segment of colon at the junction of proximal sigmoid colon and descending colon. There is an approximately 1.8 x 2.4 x 3.6 cm hypoattenuating area along the mesenteric border of the affected segment, which is favored to represent intramural abscess. No pneumoperitoneum, pneumatosis or portal venous gas. 2. Multiple other nonacute observations, as described above. Electronically Signed   By: Ree Molt M.D.   On: 10/18/2023 16:07    Anti-infectives: Anti-infectives (From admission, onward)    Start     Dose/Rate Route Frequency Ordered Stop   10/18/23 2200  piperacillin -tazobactam (ZOSYN ) IVPB 3.375 g        3.375 g 12.5 mL/hr over 240 Minutes Intravenous Every 8 hours 10/18/23 2113     10/18/23 1630  piperacillin -tazobactam (ZOSYN ) IVPB 3.375 g        3.375 g 100 mL/hr over 30 Minutes Intravenous  Once 10/18/23 1617 10/18/23 1931        Assessment/Plan Diverticulitis with intramural abscess -doing well -no pain -WBC normal -tolerating CLD, adv to soft diet today.  If tolerates this can DC home on a total of 14 days of oral abx -we discussed diet recs at home and follow  up with PCP PRN and GI in 6-8 weeks for a colonoscopy since her last one was at least 5 years ago.   FEN - soft VTE - lovenox  ID - zosyn   I reviewed hospitalist notes, last 24 h vitals and pain scores, last 48 h intake and output, last 24 h labs and trends, and last 24 h imaging results.   LOS: 1 day    Burnard FORBES Banter , [facility] Surgery 10/20/2023, 8:43 AM Please see Amion for pager number during day hours 7:00am-4:30pm or 7:00am -11:30am on weekends

## 2023-10-20 NOTE — Progress Notes (Signed)
 PT Cancellation Note  Patient Details Name: Teresa Blackburn MRN: [id number] DOB: 05/22/1950   Cancelled Treatment:     PT order received but eval deferred.  Pt reports she is back to baseline and mobilizing unassisted in room and in halls. Noted mobility team notes reporting same.   Pt with no current PT needs and PT will sign off.   Ronrico Dupin 10/20/2023, 1:21 PM

## 2023-10-23 NOTE — Discharge Summary (Signed)
 Physician Discharge Summary   Patient: Teresa Blackburn MRN: [id number] DOB: 10/14/1950  Admit date:     10/18/2023  Discharge date: 10/20/2023  Discharge Physician: Elgie Butter   PCP: Claudene Round, MD   Recommendations at discharge:  Please follow up with PCP  Please follow up with general surgery as needed.   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 [facility] Course:  Patient is a 73 years old female with past medical history of hypertension, sleep apnea, hypothyroidism and morbid obesity presented to the hospital with complaints of abdominal pain mostly over the left lower quadrant since Friday. It was associated with nausea but no vomiting.  Please follow up with gastroenterology in 2 to 4 weeks for colonoscopy.   Assessment and Plan:  Acute diverticulitis with intra mural abscess.  General Surgery consulted recommended conservative management.  She was started on IV antibiotics,  IV antiemetics pain control and IV fluids. Her symptoms improved. She was discharged on oral antibiotics to complete the course.        Hypertension Blood pressure parameters appear to be optimal     Class III obesity Lifestyle modification recommended       History of sleep apnea on CPAP continue the same           Estimated body mass index is 56.45 kg/m as calculated from the following:   Height as of this encounter: 5' 2 (1.575 m).   Weight as of this encounter: 140 kg.   Consultants: gen surgery.  Procedures performed: none.   Disposition: Home Diet recommendation:  Regular diet DISCHARGE MEDICATION: Allergies as of 10/20/2023       Reactions   Codeine Shortness Of Breath, Anxiety, Palpitations, Other (See Comments)   Gives a overwhelming feeling         Medication List     STOP taking these medications    ciprofloxacin  500 MG tablet Commonly known as: CIPRO    meloxicam 15 MG tablet Commonly known as: MOBIC       TAKE these medications    albuterol  108 (90 Base) MCG/ACT inhaler Commonly known as: VENTOLIN  HFA Inhale 1-2 puffs into the lungs every 6 (six) hours as needed for wheezing or shortness of breath.   amoxicillin -clavulanate 875-125 MG tablet Commonly known as: AUGMENTIN  Take 1 tablet by mouth 2 (two) times daily for 14 days.   carvedilol  3.125 MG tablet Commonly known as: COREG  Take 1 tablet (3.125 mg total) by mouth 2 (two) times daily with a meal.   estradiol 0.1 MG/GM vaginal cream Commonly known as: ESTRACE Place 1 g vaginally 2 (two) times a week. Wednesdays and Fridays   furosemide  40 MG tablet Commonly known as: LASIX  Take 40 mg by mouth daily.   levothyroxine  150 MCG tablet Commonly known as: SYNTHROID  Take 150 mcg by mouth daily.   losartan  100 MG tablet Commonly known as: COZAAR  Take 100 mg by mouth daily.   potassium chloride 10 MEQ tablet Commonly known as: KLOR-CON Take 20 mEq by mouth daily.   Vitamin D (Ergocalciferol) 1.25 MG (50000 UNIT) Caps capsule Commonly known as: DRISDOL Take 50,000 Units by mouth once a week.        Follow-up Information     Claudene Round, MD Follow up.   Specialty: Family Medicine  Why: As needed Contact information: [address] [place] [address] [phone]         GI doctor Follow up in 8 week(s).   Why: for a colonoscopy               Discharge Exam: Filed Weights   10/18/23 1318 10/19/23 0517 10/20/23 0547  Weight: (!) 140 kg (!) 137.6 kg (!) 137.8 kg   General exam: Appears calm and comfortable  Respiratory system: Clear to auscultation. Respiratory effort normal. Cardiovascular system: S1 & S2 heard, RRR. No JVD,  Gastrointestinal system: Abdomen is nondistended, soft and nontender.  felt. Normal bowel  sounds heard. Central nervous system: Alert and oriented. No focal neurological deficits. Extremities: Symmetric 5 x 5 power. Skin: No rashes, lesions or ulcers Psychiatry: Mood & affect appropriate.    Condition at discharge: fair  The results of significant diagnostics from this hospitalization (including imaging, microbiology, ancillary and laboratory) are listed below for reference.   Imaging Studies: CT ABDOMEN PELVIS W CONTRAST Result Date: 10/18/2023 CLINICAL DATA:  abd pain, llq worst, generalized.  Nausea. EXAM: CT ABDOMEN AND PELVIS WITH CONTRAST TECHNIQUE: Multidetector CT imaging of the abdomen and pelvis was performed using the standard protocol following bolus administration of intravenous contrast. RADIATION DOSE REDUCTION: This exam was performed according to the departmental dose-optimization program which includes automated exposure control, adjustment of the mA and/or kV according to patient size and/or use of iterative reconstruction technique. CONTRAST:  OMNIPAQUE  IOHEXOL  300 MG/ML  SOLN COMPARISON:  None Available. FINDINGS: Lower chest: There are patchy atelectatic changes in the visualized lung bases. No overt consolidation. No pleural effusion. The heart is normal in size. No pericardial effusion. Hepatobiliary: The liver is normal in size. Non-cirrhotic configuration. No suspicious mass. There is a minimally exophytic 2.5 x 2.7 cm cyst arising from the right hepatic lobe. No intrahepatic bile duct dilation. There is mild prominence of the extrahepatic bile duct, most likely due to post cholecystectomy status. Gallbladder is surgically absent. Pancreas: Unremarkable. No pancreatic ductal dilatation or surrounding inflammatory changes. Spleen: Within normal limits. No focal lesion. Adrenals/Urinary Tract: Adrenal glands are unremarkable. No suspicious renal mass. Multiple sinus cysts noted in bilateral kidneys, left more than right. There are additional several  subcentimeter sized cortical hypoattenuating structures throughout bilateral kidneys, which are too small to adequately characterize. No nephroureterolithiasis or obstructive uropathy on either side. Unremarkable urinary bladder. Stomach/Bowel: No disproportionate dilation of the small or large bowel loops. Unremarkable appendix. There is an approximately 6-7 cm long segment of colon at the junction of proximal sigmoid colon and descending colon exhibiting mild irregular circumferential wall thickening and moderate pericolonic fat stranding on the background of several diverticula, compatible with acute diverticulitis. There is an approximately 1.8 x 2.4 x 3.6 cm hypoattenuating area along the mesenteric border of the affected segment, which is favored to represent intramural abscess. No pneumoperitoneum, pneumatosis or portal venous gas. Vascular/Lymphatic: No ascites or pneumoperitoneum. No abdominal or pelvic lymphadenopathy, by size criteria. No aneurysmal dilation of the major abdominal arteries. Reproductive: Anteverted slightly bulky uterus noted, not well evaluated on this exam. No large adnexal mass seen. Other: There is a tiny fat containing umbilical hernia. The soft tissues and abdominal wall are otherwise unremarkable. Musculoskeletal: No suspicious osseous lesions. There are mild - moderate multilevel degenerative changes in the visualized spine. IMPRESSION: 1. Findings compatible with acute diverticulitis involving a short segment of colon at the junction of proximal sigmoid colon and descending colon. There is  an approximately 1.8 x 2.4 x 3.6 cm hypoattenuating area along the mesenteric border of the affected segment, which is favored to represent intramural abscess. No pneumoperitoneum, pneumatosis or portal venous gas. 2. Multiple other nonacute observations, as described above. Electronically Signed   By: Ree Molt M.D.   On: 10/18/2023 16:07    Microbiology: Results for orders placed or  performed during the hospital encounter of 03/22/23  Resp panel by RT-PCR (RSV, Flu A&B, Covid) Anterior Nasal Swab     Status: None   Collection Time: 03/22/23 10:58 AM   Specimen: Anterior Nasal Swab  Result Value Ref Range Status   SARS Coronavirus 2 by RT PCR NEGATIVE NEGATIVE Final    Comment: (NOTE) SARS-CoV-2 target nucleic acids are NOT DETECTED.  The SARS-CoV-2 RNA is generally detectable in upper respiratory specimens during the acute phase of infection. The lowest concentration of SARS-CoV-2 viral copies this assay can detect is 138 copies/mL. A negative result does not preclude SARS-Cov-2 infection and should not be used as the sole basis for treatment or other patient management decisions. A negative result may occur with  improper specimen collection/handling, submission of specimen other than nasopharyngeal swab, presence of viral mutation(s) within the areas targeted by this assay, and inadequate number of viral copies(<138 copies/mL). A negative result must be combined with clinical observations, patient history, and epidemiological information. The expected result is Negative.  Fact Sheet for Patients:  BloggerCourse.com  Fact Sheet for Healthcare Providers:  SeriousBroker.it  This test is no t yet approved or cleared by the [place]  FDA and  has been authorized for detection and/or diagnosis of SARS-CoV-2 by FDA under an Emergency Use Authorization (EUA). This EUA will remain  in effect (meaning this test can be used) for the duration of the COVID-19 declaration under Section 564(b)(1) of the Act, 21 U.S.C.section 360bbb-3(b)(1), unless the authorization is terminated  or revoked sooner.       Influenza A by PCR NEGATIVE NEGATIVE Final   Influenza B by PCR NEGATIVE NEGATIVE Final    Comment: (NOTE) The Xpert Xpress SARS-CoV-2/FLU/RSV plus assay is intended as an aid in the diagnosis of influenza from  Nasopharyngeal swab specimens and should not be used as a sole basis for treatment. Nasal washings and aspirates are unacceptable for Xpert Xpress SARS-CoV-2/FLU/RSV testing.  Fact Sheet for Patients: BloggerCourse.com  Fact Sheet for Healthcare Providers: SeriousBroker.it  This test is not yet approved or cleared by the [place]  FDA and has been authorized for detection and/or diagnosis of SARS-CoV-2 by FDA under an Emergency Use Authorization (EUA). This EUA will remain in effect (meaning this test can be used) for the duration of the COVID-19 declaration under Section 564(b)(1) of the Act, 21 U.S.C. section 360bbb-3(b)(1), unless the authorization is terminated or revoked.     Resp Syncytial Virus by PCR NEGATIVE NEGATIVE Final    Comment: (NOTE) Fact Sheet for Patients: BloggerCourse.com  Fact Sheet for Healthcare Providers: SeriousBroker.it  This test is not yet approved or cleared by the [place]  FDA and has been authorized for detection and/or diagnosis of SARS-CoV-2 by FDA under an Emergency Use Authorization (EUA). This EUA will remain in effect (meaning this test can be used) for the duration of the COVID-19 declaration under Section 564(b)(1) of the Act, 21 U.S.C. section 360bbb-3(b)(1), unless the authorization is terminated or revoked.  Performed at [facility], [address] [place], [place] [address]     Labs: CBC: Recent Labs  Lab  10/18/23 1425 10/19/23 0451 10/20/23 0854  WBC 10.9* 8.6 5.4  NEUTROABS 7.7  --  2.9  HGB 12.9 11.4* 11.3*  HCT 39.4 36.6 36.5  MCV 94.3 98.9 98.9  PLT 265 239 245   Basic Metabolic Panel: Recent Labs  Lab 10/18/23 1425 10/19/23 0451 10/20/23 0854  NA 139 140 136  K 4.3 4.0 3.7  CL 104 108 105  CO2 24 24 24   GLUCOSE 87 87 82  BUN 7* 8 8  CREATININE 0.68 0.85 0.76  CALCIUM 8.9 8.4* 8.2*   MG  --  2.4  --   PHOS  --  3.1  --    Liver Function Tests: Recent Labs  Lab 10/18/23 1425 10/19/23 0451  AST 20 15  ALT 15 14  ALKPHOS 68 51  BILITOT 0.8 1.1  PROT 7.4 6.6  ALBUMIN 3.8 2.9*   CBG: No results for input(s): GLUCAP in the last 168 hours.  Discharge time spent:  38 minutes.  Signed: Maddelynn Moosman, MD [facility]
